# Patient Record
Sex: Female | Born: 1975
Health system: Southern US, Community
[De-identification: ages and names within clinical notes are randomized; demographics above are authoritative.]

## PROBLEM LIST (undated history)

## (undated) DIAGNOSIS — T7840XA Allergy, unspecified, initial encounter: Secondary | ICD-10-CM

## (undated) HISTORY — DX: Allergy, unspecified, initial encounter: T78.40XA

## (undated) HISTORY — PX: OTHER SURGICAL HISTORY: SHX169

---

## 1996-03-06 HISTORY — PX: FRACTURE SURGERY: SHX138

## 1999-05-26 ENCOUNTER — Encounter: Admission: RE | Admit: 1999-05-26 | Discharge: 1999-06-29 | Payer: Self-pay | Admitting: Otolaryngology

## 1999-09-25 ENCOUNTER — Emergency Department (HOSPITAL_COMMUNITY): Admission: EM | Admit: 1999-09-25 | Discharge: 1999-09-25 | Payer: Self-pay | Admitting: Emergency Medicine

## 2000-01-06 ENCOUNTER — Emergency Department (HOSPITAL_COMMUNITY): Admission: EM | Admit: 2000-01-06 | Discharge: 2000-01-06 | Payer: Self-pay | Admitting: Emergency Medicine

## 2000-02-07 ENCOUNTER — Emergency Department (HOSPITAL_COMMUNITY): Admission: EM | Admit: 2000-02-07 | Discharge: 2000-02-07 | Payer: Self-pay | Admitting: Emergency Medicine

## 2000-02-07 ENCOUNTER — Encounter: Payer: Self-pay | Admitting: Emergency Medicine

## 2000-04-24 ENCOUNTER — Inpatient Hospital Stay (HOSPITAL_COMMUNITY): Admission: AD | Admit: 2000-04-24 | Discharge: 2000-04-24 | Payer: Self-pay | Admitting: *Deleted

## 2000-06-12 ENCOUNTER — Emergency Department (HOSPITAL_COMMUNITY): Admission: EM | Admit: 2000-06-12 | Discharge: 2000-06-13 | Payer: Self-pay | Admitting: *Deleted

## 2000-06-12 ENCOUNTER — Encounter: Payer: Self-pay | Admitting: *Deleted

## 2000-08-30 ENCOUNTER — Emergency Department (HOSPITAL_COMMUNITY): Admission: EM | Admit: 2000-08-30 | Discharge: 2000-08-30 | Payer: Self-pay | Admitting: Emergency Medicine

## 2000-09-04 ENCOUNTER — Encounter: Payer: Self-pay | Admitting: Emergency Medicine

## 2000-09-04 ENCOUNTER — Inpatient Hospital Stay (HOSPITAL_COMMUNITY): Admission: EM | Admit: 2000-09-04 | Discharge: 2000-09-06 | Payer: Self-pay

## 2000-09-04 ENCOUNTER — Encounter: Payer: Self-pay | Admitting: Vascular Surgery

## 2000-09-05 ENCOUNTER — Encounter: Payer: Self-pay | Admitting: Neurology

## 2000-10-31 ENCOUNTER — Encounter: Payer: Self-pay | Admitting: Obstetrics

## 2000-10-31 ENCOUNTER — Inpatient Hospital Stay (HOSPITAL_COMMUNITY): Admission: AD | Admit: 2000-10-31 | Discharge: 2000-10-31 | Payer: Self-pay | Admitting: *Deleted

## 2000-10-31 ENCOUNTER — Ambulatory Visit (HOSPITAL_COMMUNITY): Admission: RE | Admit: 2000-10-31 | Discharge: 2000-10-31 | Payer: Self-pay | Admitting: Obstetrics

## 2000-11-08 ENCOUNTER — Inpatient Hospital Stay (HOSPITAL_COMMUNITY): Admission: AD | Admit: 2000-11-08 | Discharge: 2000-11-08 | Payer: Self-pay | Admitting: Obstetrics & Gynecology

## 2001-01-03 ENCOUNTER — Inpatient Hospital Stay (HOSPITAL_COMMUNITY): Admission: AD | Admit: 2001-01-03 | Discharge: 2001-01-03 | Payer: Self-pay | Admitting: *Deleted

## 2001-01-10 ENCOUNTER — Inpatient Hospital Stay (HOSPITAL_COMMUNITY): Admission: AD | Admit: 2001-01-10 | Discharge: 2001-01-10 | Payer: Self-pay | Admitting: Obstetrics & Gynecology

## 2001-01-17 ENCOUNTER — Ambulatory Visit (HOSPITAL_COMMUNITY): Admission: RE | Admit: 2001-01-17 | Discharge: 2001-01-17 | Payer: Self-pay | Admitting: *Deleted

## 2001-03-01 ENCOUNTER — Inpatient Hospital Stay (HOSPITAL_COMMUNITY): Admission: AD | Admit: 2001-03-01 | Discharge: 2001-03-01 | Payer: Self-pay | Admitting: *Deleted

## 2001-03-01 ENCOUNTER — Inpatient Hospital Stay (HOSPITAL_COMMUNITY): Admission: AD | Admit: 2001-03-01 | Discharge: 2001-03-01 | Payer: Self-pay | Admitting: Obstetrics & Gynecology

## 2001-03-01 ENCOUNTER — Encounter: Payer: Self-pay | Admitting: *Deleted

## 2001-03-19 ENCOUNTER — Inpatient Hospital Stay (HOSPITAL_COMMUNITY): Admission: AD | Admit: 2001-03-19 | Discharge: 2001-03-19 | Payer: Self-pay | Admitting: Obstetrics

## 2001-04-19 ENCOUNTER — Observation Stay (HOSPITAL_COMMUNITY): Admission: AD | Admit: 2001-04-19 | Discharge: 2001-04-20 | Payer: Self-pay | Admitting: *Deleted

## 2001-04-25 ENCOUNTER — Encounter: Admission: RE | Admit: 2001-04-25 | Discharge: 2001-04-25 | Payer: Self-pay | Admitting: *Deleted

## 2001-05-03 ENCOUNTER — Inpatient Hospital Stay (HOSPITAL_COMMUNITY): Admission: RE | Admit: 2001-05-03 | Discharge: 2001-05-03 | Payer: Self-pay | Admitting: *Deleted

## 2001-05-06 ENCOUNTER — Encounter: Payer: Self-pay | Admitting: *Deleted

## 2001-05-06 ENCOUNTER — Inpatient Hospital Stay (HOSPITAL_COMMUNITY): Admission: RE | Admit: 2001-05-06 | Discharge: 2001-05-06 | Payer: Self-pay | Admitting: *Deleted

## 2001-05-09 ENCOUNTER — Encounter: Admission: RE | Admit: 2001-05-09 | Discharge: 2001-05-09 | Payer: Self-pay | Admitting: *Deleted

## 2001-05-13 ENCOUNTER — Inpatient Hospital Stay (HOSPITAL_COMMUNITY): Admission: AD | Admit: 2001-05-13 | Discharge: 2001-05-13 | Payer: Self-pay | Admitting: *Deleted

## 2001-05-16 ENCOUNTER — Encounter (HOSPITAL_COMMUNITY): Admission: RE | Admit: 2001-05-16 | Discharge: 2001-05-27 | Payer: Self-pay | Admitting: *Deleted

## 2001-05-16 ENCOUNTER — Encounter: Admission: RE | Admit: 2001-05-16 | Discharge: 2001-05-16 | Payer: Self-pay | Admitting: *Deleted

## 2001-05-22 ENCOUNTER — Inpatient Hospital Stay (HOSPITAL_COMMUNITY): Admission: AD | Admit: 2001-05-22 | Discharge: 2001-05-22 | Payer: Self-pay | Admitting: Obstetrics and Gynecology

## 2001-05-22 ENCOUNTER — Encounter: Payer: Self-pay | Admitting: Obstetrics and Gynecology

## 2001-05-28 ENCOUNTER — Inpatient Hospital Stay (HOSPITAL_COMMUNITY): Admission: AD | Admit: 2001-05-28 | Discharge: 2001-06-01 | Payer: Self-pay | Admitting: *Deleted

## 2003-08-22 ENCOUNTER — Emergency Department (HOSPITAL_COMMUNITY): Admission: EM | Admit: 2003-08-22 | Discharge: 2003-08-22 | Payer: Self-pay | Admitting: Emergency Medicine

## 2005-08-09 ENCOUNTER — Ambulatory Visit (HOSPITAL_COMMUNITY): Admission: RE | Admit: 2005-08-09 | Discharge: 2005-08-09 | Payer: Self-pay | Admitting: Obstetrics & Gynecology

## 2006-02-23 ENCOUNTER — Ambulatory Visit (HOSPITAL_COMMUNITY): Admission: RE | Admit: 2006-02-23 | Discharge: 2006-02-23 | Payer: Self-pay | Admitting: Gastroenterology

## 2006-05-08 ENCOUNTER — Encounter: Admission: RE | Admit: 2006-05-08 | Discharge: 2006-05-08 | Payer: Self-pay | Admitting: Gastroenterology

## 2008-11-03 ENCOUNTER — Ambulatory Visit (HOSPITAL_COMMUNITY): Admission: AD | Admit: 2008-11-03 | Discharge: 2008-11-03 | Payer: Self-pay | Admitting: Obstetrics and Gynecology

## 2008-11-03 ENCOUNTER — Encounter: Payer: Self-pay | Admitting: Emergency Medicine

## 2008-11-03 ENCOUNTER — Ambulatory Visit: Payer: Self-pay | Admitting: Family Medicine

## 2008-11-18 ENCOUNTER — Ambulatory Visit: Payer: Self-pay | Admitting: Family Medicine

## 2008-11-18 ENCOUNTER — Encounter: Payer: Self-pay | Admitting: Obstetrics and Gynecology

## 2008-11-18 LAB — CONVERTED CEMR LAB
Antibody Screen: NEGATIVE
Basophils Relative: 0 % (ref 0–1)
Eosinophils Absolute: 0.2 10*3/uL (ref 0.0–0.7)
Eosinophils Relative: 3 % (ref 0–5)
HCT: 33.2 % — ABNORMAL LOW (ref 36.0–46.0)
Hepatitis B Surface Ag: NEGATIVE
Hgb A2 Quant: 2.9 % (ref 2.2–3.2)
Hgb F Quant: 0 % (ref 0.0–2.0)
Hgb S Quant: 0 % (ref 0.0–0.0)
MCV: 100 fL (ref 78.0–100.0)
RBC: 3.32 M/uL — ABNORMAL LOW (ref 3.87–5.11)
Rh Type: POSITIVE
Rubella: 88.9 intl units/mL — ABNORMAL HIGH
WBC: 7 10*3/uL (ref 4.0–10.5)

## 2008-12-16 ENCOUNTER — Ambulatory Visit: Payer: Self-pay | Admitting: Obstetrics and Gynecology

## 2008-12-16 LAB — CONVERTED CEMR LAB: Vitamin B-12: 545 pg/mL (ref 211–911)

## 2008-12-21 ENCOUNTER — Encounter: Payer: Self-pay | Admitting: Family Medicine

## 2008-12-21 ENCOUNTER — Ambulatory Visit (HOSPITAL_COMMUNITY): Admission: RE | Admit: 2008-12-21 | Discharge: 2008-12-21 | Payer: Self-pay | Admitting: Obstetrics and Gynecology

## 2009-01-11 ENCOUNTER — Ambulatory Visit (HOSPITAL_COMMUNITY): Admission: RE | Admit: 2009-01-11 | Discharge: 2009-01-11 | Payer: Self-pay | Admitting: Obstetrics and Gynecology

## 2009-01-13 ENCOUNTER — Ambulatory Visit: Payer: Self-pay | Admitting: Obstetrics and Gynecology

## 2009-01-19 ENCOUNTER — Inpatient Hospital Stay (HOSPITAL_COMMUNITY): Admission: AD | Admit: 2009-01-19 | Discharge: 2009-01-19 | Payer: Self-pay | Admitting: Obstetrics & Gynecology

## 2009-01-19 ENCOUNTER — Ambulatory Visit: Payer: Self-pay | Admitting: Obstetrics and Gynecology

## 2009-01-25 ENCOUNTER — Ambulatory Visit (HOSPITAL_COMMUNITY): Admission: RE | Admit: 2009-01-25 | Discharge: 2009-01-25 | Payer: Self-pay | Admitting: Obstetrics and Gynecology

## 2009-01-27 ENCOUNTER — Ambulatory Visit: Payer: Self-pay | Admitting: Obstetrics and Gynecology

## 2009-02-10 ENCOUNTER — Ambulatory Visit (HOSPITAL_COMMUNITY): Admission: RE | Admit: 2009-02-10 | Discharge: 2009-02-10 | Payer: Self-pay | Admitting: Obstetrics and Gynecology

## 2009-02-10 ENCOUNTER — Ambulatory Visit: Payer: Self-pay | Admitting: Obstetrics and Gynecology

## 2009-02-17 ENCOUNTER — Ambulatory Visit (HOSPITAL_COMMUNITY): Admission: RE | Admit: 2009-02-17 | Discharge: 2009-02-17 | Payer: Self-pay | Admitting: Obstetrics & Gynecology

## 2009-02-17 ENCOUNTER — Encounter: Payer: Self-pay | Admitting: Family

## 2009-02-17 ENCOUNTER — Ambulatory Visit: Payer: Self-pay | Admitting: Obstetrics and Gynecology

## 2009-02-17 LAB — CONVERTED CEMR LAB
Anticardiolipin IgA: <3 (ref <10)
Anticardiolipin IgG: <3 (ref <10)
Anticardiolipin IgM: <3 (ref <10)
Homocysteine: 4.8 micromoles/L (ref 4.0–15.4)
Protein S Ag, Total: 75 % (ref 70–140)

## 2009-02-25 ENCOUNTER — Ambulatory Visit: Payer: Self-pay | Admitting: Obstetrics & Gynecology

## 2009-03-04 ENCOUNTER — Ambulatory Visit: Payer: Self-pay | Admitting: Obstetrics & Gynecology

## 2009-03-09 ENCOUNTER — Ambulatory Visit: Payer: Self-pay | Admitting: Family Medicine

## 2009-03-11 ENCOUNTER — Ambulatory Visit: Payer: Self-pay | Admitting: Obstetrics & Gynecology

## 2009-03-18 ENCOUNTER — Ambulatory Visit: Payer: Self-pay | Admitting: Family Medicine

## 2009-03-22 ENCOUNTER — Ambulatory Visit: Payer: Self-pay | Admitting: Obstetrics and Gynecology

## 2009-03-29 ENCOUNTER — Ambulatory Visit: Payer: Self-pay | Admitting: Obstetrics & Gynecology

## 2009-04-05 ENCOUNTER — Ambulatory Visit: Payer: Self-pay | Admitting: Family Medicine

## 2009-04-12 ENCOUNTER — Ambulatory Visit: Payer: Self-pay | Admitting: Obstetrics & Gynecology

## 2009-04-12 ENCOUNTER — Encounter: Payer: Self-pay | Admitting: Obstetrics & Gynecology

## 2009-04-12 LAB — CONVERTED CEMR LAB
HCT: 32.9 % — ABNORMAL LOW (ref 36.0–46.0)
Hemoglobin: 10.7 g/dL — ABNORMAL LOW (ref 12.0–15.0)
MCHC: 32.5 g/dL (ref 30.0–36.0)
MCV: 95.6 fL (ref 78.0–100.0)
RDW: 13.7 % (ref 11.5–15.5)

## 2009-04-19 ENCOUNTER — Ambulatory Visit: Payer: Self-pay | Admitting: Obstetrics & Gynecology

## 2009-04-24 ENCOUNTER — Ambulatory Visit: Payer: Self-pay | Admitting: Advanced Practice Midwife

## 2009-04-24 ENCOUNTER — Inpatient Hospital Stay (HOSPITAL_COMMUNITY): Admission: AD | Admit: 2009-04-24 | Discharge: 2009-04-24 | Payer: Self-pay | Admitting: Family Medicine

## 2009-04-26 ENCOUNTER — Ambulatory Visit: Payer: Self-pay | Admitting: Obstetrics & Gynecology

## 2009-04-28 ENCOUNTER — Inpatient Hospital Stay (HOSPITAL_COMMUNITY): Admission: AD | Admit: 2009-04-28 | Discharge: 2009-04-29 | Payer: Self-pay | Admitting: Obstetrics and Gynecology

## 2009-05-03 ENCOUNTER — Ambulatory Visit: Payer: Self-pay | Admitting: Obstetrics & Gynecology

## 2009-05-05 ENCOUNTER — Inpatient Hospital Stay (HOSPITAL_COMMUNITY): Admission: AD | Admit: 2009-05-05 | Discharge: 2009-05-05 | Payer: Self-pay | Admitting: Obstetrics & Gynecology

## 2009-05-17 ENCOUNTER — Ambulatory Visit: Payer: Self-pay | Admitting: Obstetrics & Gynecology

## 2009-05-22 ENCOUNTER — Inpatient Hospital Stay (HOSPITAL_COMMUNITY): Admission: AD | Admit: 2009-05-22 | Discharge: 2009-05-22 | Payer: Self-pay | Admitting: Obstetrics & Gynecology

## 2009-05-24 ENCOUNTER — Encounter: Payer: Self-pay | Admitting: Family

## 2009-05-24 ENCOUNTER — Ambulatory Visit: Payer: Self-pay | Admitting: Obstetrics & Gynecology

## 2009-05-24 LAB — CONVERTED CEMR LAB
Chlamydia, DNA Probe: NEGATIVE
GC Probe Amp, Genital: NEGATIVE

## 2009-05-25 ENCOUNTER — Encounter: Payer: Self-pay | Admitting: Family

## 2009-05-31 ENCOUNTER — Ambulatory Visit: Payer: Self-pay | Admitting: Obstetrics & Gynecology

## 2009-06-07 ENCOUNTER — Other Ambulatory Visit: Payer: Self-pay | Admitting: Obstetrics & Gynecology

## 2009-06-07 ENCOUNTER — Ambulatory Visit: Payer: Self-pay | Admitting: Obstetrics & Gynecology

## 2009-06-08 ENCOUNTER — Ambulatory Visit: Payer: Self-pay | Admitting: Obstetrics and Gynecology

## 2009-06-08 ENCOUNTER — Inpatient Hospital Stay (HOSPITAL_COMMUNITY): Admission: AD | Admit: 2009-06-08 | Discharge: 2009-06-10 | Payer: Self-pay | Admitting: Obstetrics & Gynecology

## 2009-06-09 HISTORY — PX: TUBAL LIGATION: SHX77

## 2009-07-16 ENCOUNTER — Ambulatory Visit: Payer: Self-pay | Admitting: Obstetrics and Gynecology

## 2010-05-21 LAB — POCT URINALYSIS DIP (DEVICE)
Glucose, UA: NEGATIVE mg/dL
Urobilinogen, UA: 0.2 mg/dL (ref 0.0–1.0)

## 2010-05-22 LAB — POCT URINALYSIS DIP (DEVICE)
Bilirubin Urine: NEGATIVE
Glucose, UA: NEGATIVE mg/dL
Ketones, ur: NEGATIVE mg/dL
pH: 5.5 (ref 5.0–8.0)

## 2010-05-25 LAB — CBC
HCT: 33.7 % — ABNORMAL LOW (ref 36.0–46.0)
HCT: 33.1 % — ABNORMAL LOW (ref 36.0–46.0)
HCT: 35.3 % — ABNORMAL LOW (ref 36.0–46.0)
Hemoglobin: 11.1 g/dL — ABNORMAL LOW (ref 12.0–15.0)
Hemoglobin: 11.9 g/dL — ABNORMAL LOW (ref 12.0–15.0)
MCHC: 33.4 g/dL (ref 30.0–36.0)
MCV: 99.8 fL (ref 78.0–100.0)
MCV: 101.4 fL — ABNORMAL HIGH (ref 78.0–100.0)
Platelets: 222 10*3/uL (ref 150–400)
RBC: 3.27 MIL/uL — ABNORMAL LOW (ref 3.87–5.11)
RBC: 3.53 MIL/uL — ABNORMAL LOW (ref 3.87–5.11)
RDW: 14.2 % (ref 11.5–15.5)
WBC: 12.4 10*3/uL — ABNORMAL HIGH (ref 4.0–10.5)

## 2010-05-25 LAB — POCT URINALYSIS DIP (DEVICE)
Bilirubin Urine: NEGATIVE
Bilirubin Urine: NEGATIVE
Glucose, UA: NEGATIVE mg/dL
Glucose, UA: NEGATIVE mg/dL
Hgb urine dipstick: NEGATIVE
Hgb urine dipstick: NEGATIVE
Ketones, ur: NEGATIVE mg/dL
Ketones, ur: NEGATIVE mg/dL
Ketones, ur: NEGATIVE mg/dL
Nitrite: NEGATIVE
Nitrite: NEGATIVE
Protein, ur: NEGATIVE mg/dL
Protein, ur: NEGATIVE mg/dL
Protein, ur: NEGATIVE mg/dL
Specific Gravity, Urine: 1.015 (ref 1.005–1.030)
Specific Gravity, Urine: <=1.005 (ref 1.005–1.030)
Urobilinogen, UA: 0.2 mg/dL (ref 0.0–1.0)
Urobilinogen, UA: 0.2 mg/dL (ref 0.0–1.0)
Urobilinogen, UA: 0.2 mg/dL (ref 0.0–1.0)
pH: 5.5 (ref 5.0–8.0)
pH: 6 (ref 5.0–8.0)
pH: 6 (ref 5.0–8.0)
pH: 7 (ref 5.0–8.0)

## 2010-05-25 LAB — URINALYSIS, ROUTINE W REFLEX MICROSCOPIC
Bilirubin Urine: NEGATIVE
Bilirubin Urine: NEGATIVE
Glucose, UA: NEGATIVE mg/dL
Ketones, ur: NEGATIVE mg/dL
Protein, ur: NEGATIVE mg/dL
Protein, ur: NEGATIVE mg/dL

## 2010-05-25 LAB — WET PREP, GENITAL
Clue Cells Wet Prep HPF POC: NONE SEEN
Trich, Wet Prep: NONE SEEN
Trich, Wet Prep: NONE SEEN
Yeast Wet Prep HPF POC: NONE SEEN

## 2010-05-27 LAB — POCT URINALYSIS DIP (DEVICE)
Bilirubin Urine: NEGATIVE
Bilirubin Urine: NEGATIVE
Glucose, UA: NEGATIVE mg/dL
Glucose, UA: NEGATIVE mg/dL
Hgb urine dipstick: NEGATIVE
Ketones, ur: NEGATIVE mg/dL
Ketones, ur: NEGATIVE mg/dL
Nitrite: NEGATIVE
Nitrite: NEGATIVE
Protein, ur: 30 mg/dL — AB
Protein, ur: NEGATIVE mg/dL
Specific Gravity, Urine: 1.015 (ref 1.005–1.030)
Urobilinogen, UA: 0.2 mg/dL (ref 0.0–1.0)
Urobilinogen, UA: 0.2 mg/dL (ref 0.0–1.0)
pH: 6 (ref 5.0–8.0)

## 2010-05-27 LAB — URINALYSIS, ROUTINE W REFLEX MICROSCOPIC
Bilirubin Urine: NEGATIVE
Glucose, UA: NEGATIVE mg/dL
Hgb urine dipstick: NEGATIVE
Ketones, ur: NEGATIVE mg/dL
Protein, ur: NEGATIVE mg/dL
pH: 6.5 (ref 5.0–8.0)

## 2010-05-27 LAB — URINE MICROSCOPIC-ADD ON

## 2010-06-06 LAB — POCT URINALYSIS DIP (DEVICE)
Bilirubin Urine: NEGATIVE
Glucose, UA: NEGATIVE mg/dL
Nitrite: NEGATIVE
Urobilinogen, UA: 0.2 mg/dL (ref 0.0–1.0)

## 2010-06-07 LAB — POCT URINALYSIS DIP (DEVICE)
Bilirubin Urine: NEGATIVE
Bilirubin Urine: NEGATIVE
Glucose, UA: NEGATIVE mg/dL
Glucose, UA: NEGATIVE mg/dL
Ketones, ur: NEGATIVE mg/dL
Ketones, ur: NEGATIVE mg/dL
Nitrite: NEGATIVE
Specific Gravity, Urine: 1.01 (ref 1.005–1.030)

## 2010-06-08 LAB — POCT URINALYSIS DIP (DEVICE)
Bilirubin Urine: NEGATIVE
Bilirubin Urine: NEGATIVE
Glucose, UA: NEGATIVE mg/dL
Glucose, UA: NEGATIVE mg/dL
Hgb urine dipstick: NEGATIVE
Ketones, ur: NEGATIVE mg/dL
Ketones, ur: NEGATIVE mg/dL
Nitrite: NEGATIVE
Specific Gravity, Urine: 1.01 (ref 1.005–1.030)
Urobilinogen, UA: 0.2 mg/dL (ref 0.0–1.0)

## 2010-06-09 LAB — POCT URINALYSIS DIP (DEVICE)
Bilirubin Urine: NEGATIVE
Ketones, ur: NEGATIVE mg/dL
Specific Gravity, Urine: 1.01 (ref 1.005–1.030)
pH: 6.5 (ref 5.0–8.0)

## 2010-06-10 LAB — POCT URINALYSIS DIP (DEVICE)
Glucose, UA: NEGATIVE mg/dL
Ketones, ur: NEGATIVE mg/dL
Protein, ur: NEGATIVE mg/dL
Specific Gravity, Urine: <=1.005 (ref 1.005–1.030)

## 2010-06-11 LAB — WET PREP, GENITAL
Clue Cells Wet Prep HPF POC: NONE SEEN
Trich, Wet Prep: NONE SEEN
WBC, Wet Prep HPF POC: NONE SEEN
Yeast Wet Prep HPF POC: NONE SEEN

## 2010-06-11 LAB — CBC
HCT: 25.6 % — ABNORMAL LOW (ref 36.0–46.0)
HCT: 35.7 % — ABNORMAL LOW (ref 36.0–46.0)
Hemoglobin: 8.8 g/dL — ABNORMAL LOW (ref 12.0–15.0)
Hemoglobin: 12.3 g/dL (ref 12.0–15.0)
MCHC: 34.3 g/dL (ref 30.0–36.0)
MCV: 98.3 fL (ref 78.0–100.0)
RBC: 2.57 MIL/uL — ABNORMAL LOW (ref 3.87–5.11)
RDW: 13 % (ref 11.5–15.5)
WBC: 8.3 10*3/uL (ref 4.0–10.5)

## 2010-06-11 LAB — CROSSMATCH: Antibody Screen: NEGATIVE

## 2010-06-11 LAB — URINALYSIS, ROUTINE W REFLEX MICROSCOPIC
Bilirubin Urine: NEGATIVE
Glucose, UA: NEGATIVE mg/dL
Ketones, ur: 15 mg/dL — AB
Nitrite: NEGATIVE
Specific Gravity, Urine: 1.019 (ref 1.005–1.030)
pH: 7 (ref 5.0–8.0)

## 2010-06-11 LAB — DIFFERENTIAL
Eosinophils Absolute: 0.4 10*3/uL (ref 0.0–0.7)
Eosinophils Relative: 4 % (ref 0–5)
Lymphocytes Relative: 30 % (ref 12–46)
Lymphs Abs: 2.5 10*3/uL (ref 0.7–4.0)
Monocytes Absolute: 0.7 10*3/uL (ref 0.1–1.0)
Monocytes Relative: 8 % (ref 3–12)

## 2010-06-11 LAB — BASIC METABOLIC PANEL
Chloride: 103 mEq/L (ref 96–112)
GFR calc non Af Amer: >60 mL/min (ref >60)
Potassium: 4.1 mEq/L (ref 3.5–5.1)
Sodium: 133 mEq/L — ABNORMAL LOW (ref 135–145)

## 2010-06-11 LAB — GC/CHLAMYDIA PROBE AMP, GENITAL: GC Probe Amp, Genital: NEGATIVE

## 2010-07-19 NOTE — Op Note (Signed)
Lauren Frost, Lauren Frost                ACCOUNT NO.:  [id number]   MEDICAL RECORD NO.:  [id number]          PATIENT TYPE:  AMB   LOCATION:  [facility]                          FACILITY:  [facility]   PHYSICIAN:  Tanya S. Shawnie Pons, M.D.   DATE OF BIRTH:  20-Jan-1976   DATE OF PROCEDURE:  11/03/2008  DATE OF DISCHARGE:                               OPERATIVE REPORT   PREOPERATIVE DIAGNOSIS:  Hemoperitoneum.   POSTOPERATIVE DIAGNOSES:  Hemoperitoneum, hemorrhagic ovarian cyst,  intrauterine pregnancy at 5 weeks.   PROCEDURE:  Diagnostic laparoscopy, evacuation of hemorrhage and clot,  and cauterization of the ovarian cyst wall.   SURGEON:  Shelbie Proctor. Shawnie Pons, MD   ASSISTANT:  None.   ANESTHESIA:  General and local.   FINDINGS:  Large hemoperitoneum of at least a liter, total of 1.5  liters.  A large defect in the patient's left ovary with adherent clot  and some minimal bleeding around the edge of the cyst.   SPECIMENS:  None.   ESTIMATED BLOOD LOSS:  One liter.   COMPLICATIONS:  None known.   REASON FOR PROCEDURE:  Briefly, the patient is a 35 year old gravida 3,  para 2, who is 5 weeks' pregnant with a known intrauterine pregnancy,  who presented to [facility], initially presented with acute abdominal  pain.  She was found to be pregnant, had an intrauterine pregnancy, plus  marked amount of clot and complex mixture in the cul-de-sac.  She was  sent from there to [facility] by Dr. Marcelle Overlie, who then had  an emergency insert care, was turned over to me.  She had further  ultrasound here that showed enlargement of the complex material and a  drop of her hemoglobin from 12 to 8.  So she probably had an acute  abdomen and bleeding in her belly from either heterotopic pregnancy or  an ovarian cyst.   PROCEDURE:  The patient was taken to the OR.  She was placed in dorsal  lithotomy in [place] stirrups.  She was prepped and draped in the usual  sterile fashion.  A Foley catheter was placed  inside the bladder.  A  speculum was placed inside the vagina to manipulate the uterus though  she has a known IUP.  Attention was then turned to the abdomen.  A 5 mL  of 0.25% Marcaine were injected about the umbilicus.  A vertical  incision was made through the umbilicus and the peritoneal cavity was  entered sharply.  Two inches of the fascia were tied with 0 Vicryl on UR-  6 needle.  A Hasson trocar was then placed inside this incision and the  pneumoperitoneum created.  Initially, a large amount of blood was noted  and a 5-mm port was placed suprapubically under direct visualization.  The Nezhat was used, however, there was so much clot that really was not  effective.  So the camera was changed to a 5-mm camera, and the large  attachments, further Nezhat was then used to evacuate a large amount of  clotted blood.  The tubes looked to be exceptionally normal.  The  patient's right ovary was also normal.  The left ovary was enlarged and  upon taking it up out of the cul-de-sac, a large defect was found in the  ovary approximately 1.5 to 2 cm.  There was adherent clot in the ovarian  wall and minimal bleeding from the ovarian cyst wall.  A second 5-mm  port was placed in the left lower quadrant under direct visualization.  The clot was cleaned out with the Nezhat and the bipolar was used to  cauterize any bleeders on the cyst wall.  There was minimal bleeding  noted from the edge of the ovary at the end of the case.  A large amount  of clot to still be evacuated, which was done.  The patient's appendix  was looked out and felt to be normal.  The patient's tubes also appeared  normal.  The liver edge was looked out and was felt to be normal.  The  uterus appeared normal.  Bowel structures appeared normal.  Once the  patient was felt to be hemostatic, both 5-mm trocars were removed under  direct visualization without difficulty.  A 10 mm, the Hassan trocar  also removed without difficulty.   The umbilical incision was closed with  aforementioned 0 Vicryl on the UR-6 and 2 figure-of-eights making sure  to close the fascia.  The top of the umbilical incision was closed with  3-0 Vicryl on an excellent needle in a subcuticular fashion.  The 5-mm  ports were closed with the same in subcuticular fashion and then the  incisions were closed with dermabond.  All instrument, needle, and lap  counts correct x2.  The sponge was taken out from the vagina.  The  patient was awakened and taken to recovery in stable condition.      Shelbie Proctor. Shawnie Pons, M.D.  Electronically Signed     TSP/MEDQ  D:  11/03/2008  T:  11/03/2008  Job:  [id number]

## 2010-07-22 NOTE — Consult Note (Signed)
[place]. [facility]  Patient:    Lauren Frost, Lauren Frost                        MRN: [id number] Proc. Date: 09/05/00 Adm. Date:  [id number] Attending:  Bennye Alm CC:         Di Kindle. Edilia Bo, M.D.  [facility], [address] [facility].   Consultation Report  HISTORY OF PRESENT ILLNESS:  Lauren Frost is a 35 year old right-handed black female born 1975-12-21 with a history of a left superficial jugular vein thrombosis that occurred spontaneously approximately a week ago.  Along with onset of the thrombosis, the patient developed onset of headache that has been daily in nature.  The patient notes that the headaches are whole encephalic unassociated with nausea or vomiting but the patient will have some blurred vision and does report some occasional double vision with the above events. The patient denies any numbness or weakness on the face, arms, or legs but does report an occasional problem with tingling in the fingers of the left hand and the toes in the left foot.  The patient has not had any problems with balance.  Denies any true neck stiffness but has some localized tenderness around the site of the left superficial jugular vein thrombosis.  Doppler study has excluded thrombus in the internal jugular vein.  The patient has also reported some swelling of the left leg but a Doppler study has not shown any evidence of venous thrombosis.  The patient denies any blackout spells or confusion.  No prior history of headaches is noted prior to the last week or so.  The patient denies any family history of migraines.  There is no family history of venous thrombosis.  A hypercoagulable state workup is underway. Neurology has been asked to see this patient for further evaluation.  PAST MEDICAL HISTORY: 1. Left superficial jugular vein thrombosis, as above. 2. History of headache. 3. History of left tibia-fibula fracture, status  post surgery in the past.  CURRENT MEDICATIONS:  The patient was on no medications prior to admission, other than over-the-counter medications.  ALLERGIES:  ASPIRIN.  SOCIAL HISTORY:  She smokes about a pack of cigarettes a week and denies regular alcohol use.  She does drink on occasion.  The patient denies any history of IV drug abuse or any other illicit drug use.  The patient is married, lives in the [place] area.  She has one child, age 74, alive and well.  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  FAMILY HISTORY:  Both parents are alive.  The patient has one sister who is alive and well.  Immediate family has no medical illnesses.  The patient denies any family history of diabetes, heart disease, high blood pressure, or migraines or venous thrombosis.  REVIEW OF SYSTEMS:  No fevers or chills.  The patient denies any problems with swallowing.  Denies shortness of breath, chest pain.  She does note some double vision, as above.  Denies any nausea, vomiting, balance problems.  She does note some dizziness off and on.  She has headaches off and on through the day.  The headaches may last about an hour or so.  PHYSICAL EXAMINATION:  VITAL SIGNS:  Blood pressure 132/69, heart rate 65, respiratory rate 16, temperature afebrile.  GENERAL:  This patient is a fairly well-developed black female who is alert and cooperative at the time of examination.  HEENT:  Head  is atraumatic.  Eyes:  Pupils are equal, round and reactive to light.  Disks are flat bilaterally.  Good venous pulsations are seen.  NECK:  Supple.  No carotid bruits are noted.  Obvious venous thrombosis noted on the left neck.  RESPIRATORY:  Clear.  CARDIOVASCULAR:  Regular rate and rhythm.  No obvious murmurs or rubs noted.  EXTREMITIES:  Without significant edema.  NEUROLOGIC:  Cranial nerves as above.  Facial symmetry is present.  The patient has good sensation of the face to pinprick and  soft touch bilaterally. The patient has good strength to facial muscles and the muscles to head turning and shoulder shrug bilaterally.  Visual fields are full to double simultaneous stimulation.  Extraocular movements appear to be intact.  Speech is well enunciated and not aphasia.  Motor testing reveals 5/5 strength on all four extremities.  Good symmetric motor tone is noted throughout.  Sensory testing is intact to pinprick, soft touch, vibratory sensation throughout. The patient has good finger-nose-finger and heel-to-shin bilaterally.  Gait is normal.  Tandem gait normal.  Romberg negative.  No pronator drift is seen. Deep tendon reflexes are symmetric and normal.  Toes are downgoing bilaterally.  LABORATORY DATA:  Sodium 139, potassium 3.7, chloride 106, CO2 30, glucose 96, BUN 5, creatinine 0.7, calcium 8.6.  INR 1.1.  White count 6.3, hemoglobin 12.3, hematocrit 35.7, MCV 92.5, platelets 253.  Lupus anticoagulant is pending.  Antithrombin III level pending.  Protein S and protein C levels pending.  Urine pregnancy test negative.  Venous Doppler study of the lower extremities is negative.  IMPRESSION: 1. History of recent onset of headache. 2. Left superficial jugular vein thrombosis. 3. History of tobacco abuse.  This patient is not on birth control pills at this point.  The patient does not have any prior history of venous thrombosis with the exception of some sort of a vein problem on the right arm as a child that she is not sure about, claiming that part of the vein had to be removed.  The patient is undergoing hypercoagulable stage workup, which is appropriate at this time.  The patient denies any trauma to the left neck, any infection around the left neck that might have induced the thrombosis.  The examination today shows evidence of excellent venous pulsations in the disks.  No papilledema is noted.  I do not  believe this patient has any increased intracranial  pressure.  Doppler studies have shown no involvement of the internal jugular vein.  PLAN: 1. MRI scan of the brain. 2. MRV. 3. We would also recommend checking a factor V Leiden study, homocystine    level, B12/folate level.  We will follow this patient while in-house.  We will initiate treatment with Amerge 2.5 mg one twice a day.  If this is not effective, may go on to use the DHE 45 protocol.  The patient may take narcotics at this point as needed. DD:  09/05/00 TD:  09/05/00 Job: [id number]

## 2010-07-22 NOTE — Consult Note (Signed)
[place]. [facility]  Patient:    Lauren Frost, Lauren Frost                        MRN: [id number] Proc. Date: 09/04/00 Adm. Date:  [id number] Attending:  Earline Mayotte R                          Consultation Report  REASON FOR CONSULTATION:  Superficial thrombophlebitis of the left neck.  HISTORY:  This is a 35 year old woman who had awakened on August 30, 2000 and noticed some swelling in her left neck.  She presented to the emergency department on that day and worked up at that time including an ultrasound of the neck which showed no evidence of deep venous thrombosis involving the internal  jugular vein of proximal subclavian veins, however, there was thrombus within the superficial veins of the left neck.  She was sent home with recommendation for warm compresses, nonsteroidal anti-inflammatory medicine and she was to follow up with me tomorrow, September 05, 2000.  I had not seen this patient previously.  I did not see her in the emergency room that day.  Today, she returns and she states that her symptoms have progressed despite conservative treatment.  Patient denies any previous history of clotting disorders.  She is unaware of any family history of clotting disorders.  She has no previous history of DVT or phlebitis.  She has been following her instructions with warm compresses to the neck and has been taking ibuprofen.  PAST MEDICAL HISTORY:  Unremarkable.  She denies any history of diabetes, hypertension, hypercholesterolemia or history of cardiac disease.  She does have a history of a murmur.  SOCIAL HISTORY:  She is married and has one daughter.  She smokes 1 pack of cigarettes every 10 days.  FAMILY HISTORY:  She denies any history of premature cardiovascular disease. There is no history of clotting disorders.  REVIEW OF SYSTEMS:  She has had no fever or chills.  She has had no other symptoms besides headaches and some intermittent blurred vision over the  last week.  PHYSICAL EXAMINATION:  VITAL SIGNS:  Blood pressure is 114/69.  Heart rate is 59.  Temperature is 96.9.  NECK:  She has what appears to be thrombosed veins of the left neck which are mildly tender.  There is no significant reaction around them.  She has no carotid bruits.  She has no supraclavicular bruits.  LUNGS:  Clear bilaterally to auscultation.  CARDIAC:  She has a regular rate and rhythm.  ABDOMEN:  Soft and nontender.  EXTREMITIES:  She has some mild bilateral lower extremity swelling.  IMAGING STUDY:  Patient did undergo a venous Duplex of the lower extremities here in the ER today which showed no evidence of DVT or superficial thrombus.  ASSESSMENT AND PLAN:  This patient has likely superficial thrombophlebitis of the left neck, however, she states that her symptoms have progressed despite conservative treatment and she feels that this area has gotten significantly larger.  I am examining her for the first time.  Given the progression of her symptoms and enlargement of this area, I have recommended that she be admitted and placed on intravenous heparin.  We will also obtain a hypercoagulable workup and consider starting Coumadin.  In addition, we will obtain further diagnostic studies of the neck, either CT or MRI, after I have had a chance to discuss this with the radiologist.  We will also continue her on her nonsteroidal anti-inflammatory medicines. DD:  09/04/00 TD:  09/04/00 Job: [id number]

## 2010-11-10 IMAGING — US US OB DETAIL+14 WK
1 series · 18 of 28 positions shown · non-contrast
Comparison: none

OBSTETRICAL ULTRASOUND:
 This ultrasound was performed in The [HOSPITAL], and the AS OB/GYN report will be stored to [REDACTED] PACS.  This report is also available in [HOSPITAL]?s accessANYware.

[Series 1: us ob detail+14 wk · 18 of 106 slices shown]
[im 1/106]
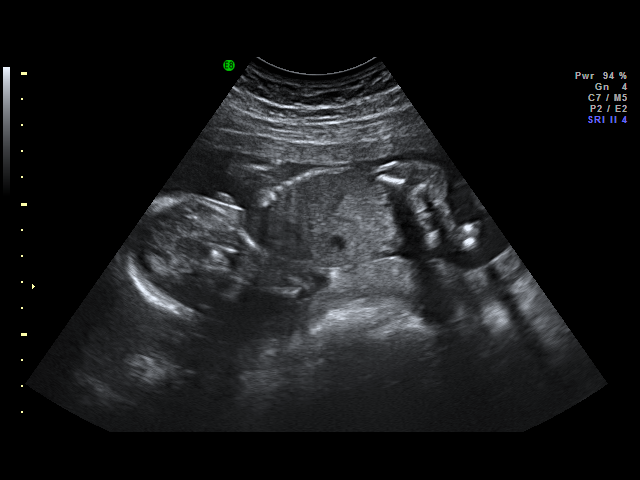
[im 8/106]
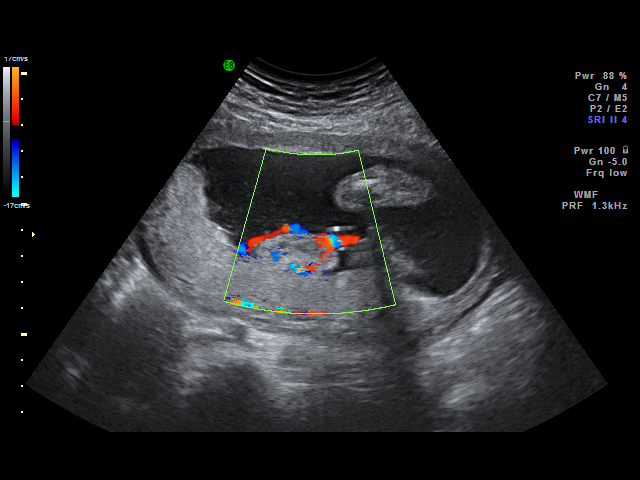
[im 12/106]
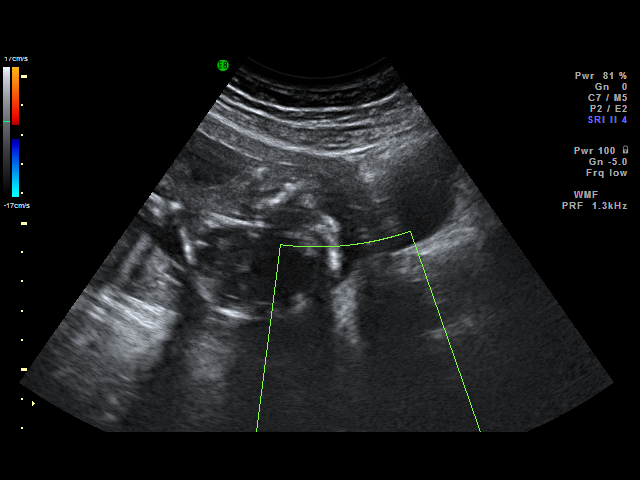
[im 20/106]
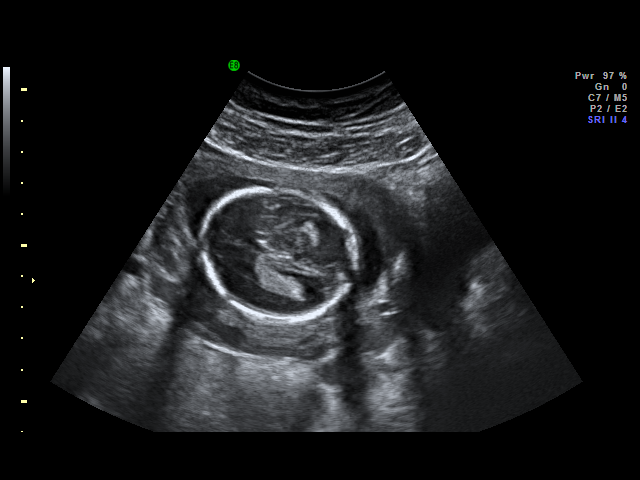
[im 28/106]
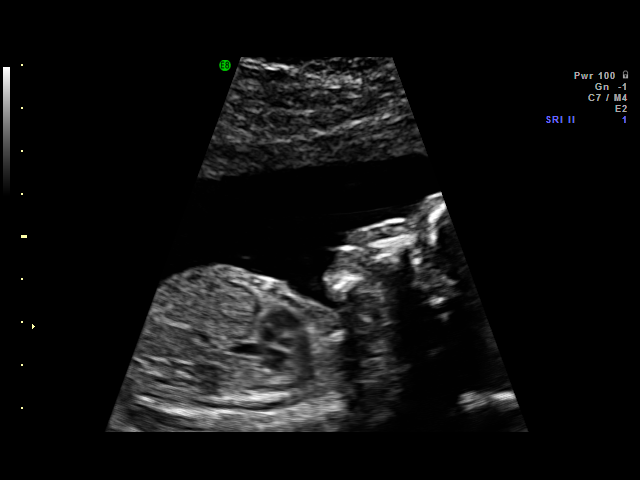
[im 32/106]
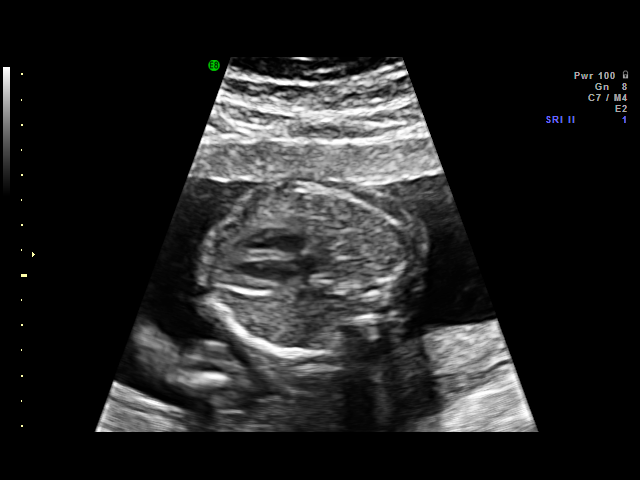
[im 39/106]
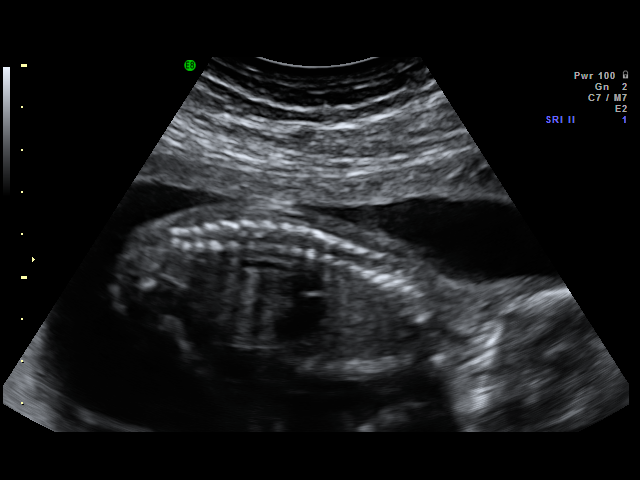
[im 43/106]
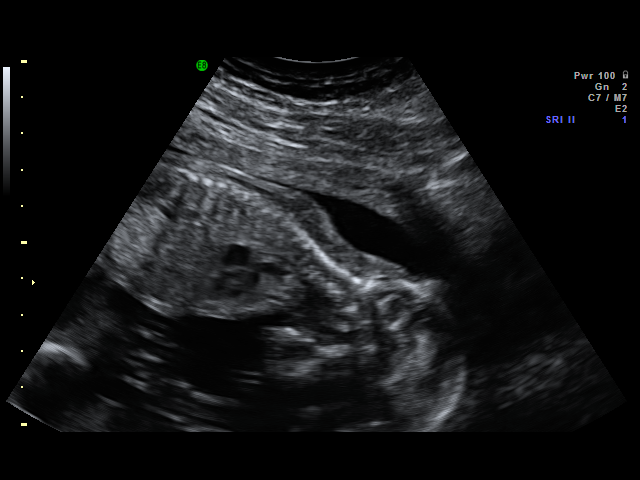
[im 51/106]
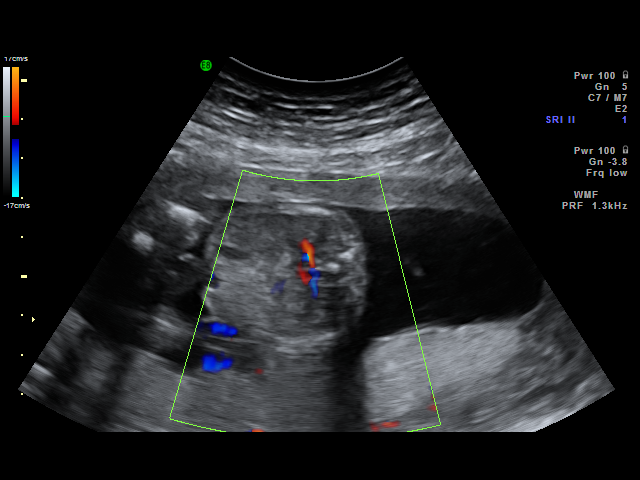
[im 55/106]
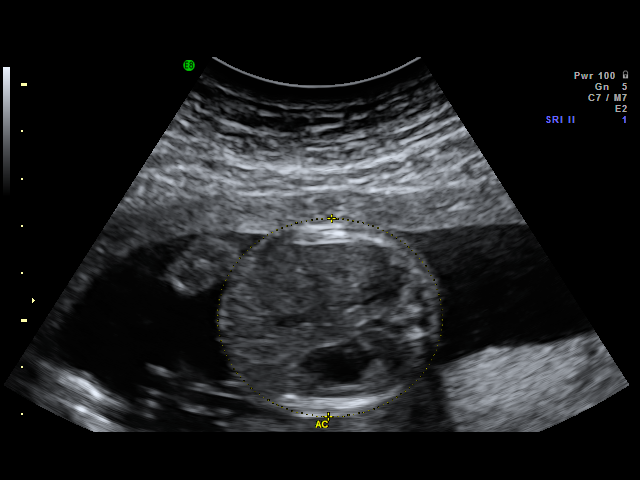
[im 63/106]
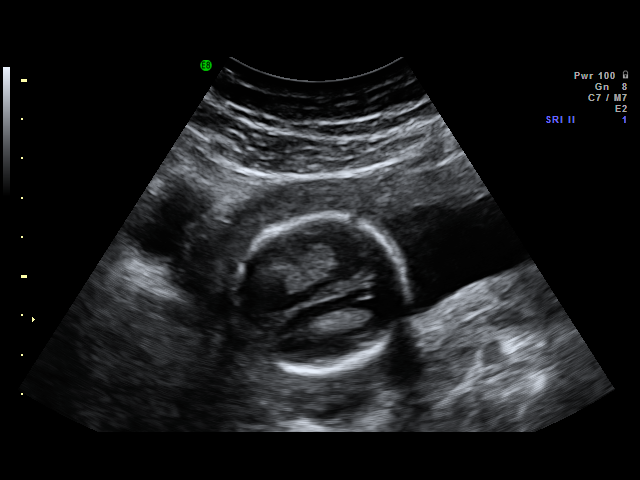
[im 67/106]
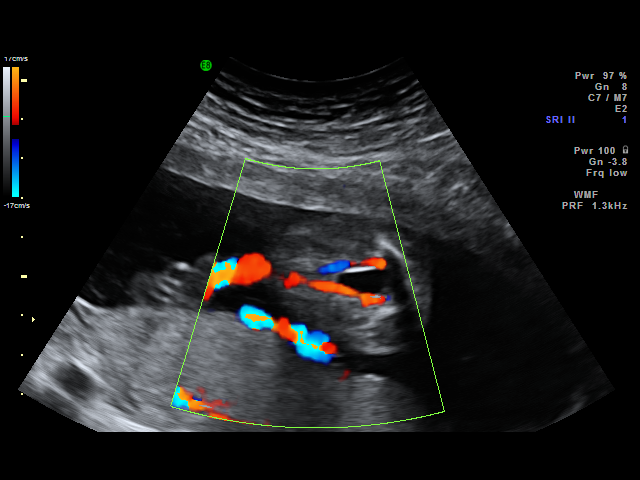
[im 74/106]
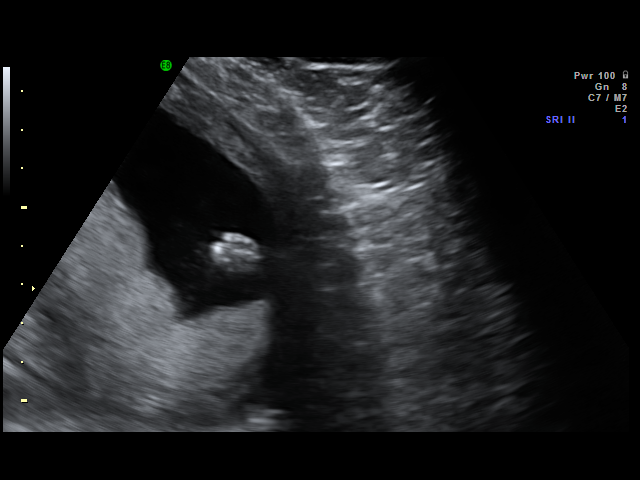
[im 82/106]
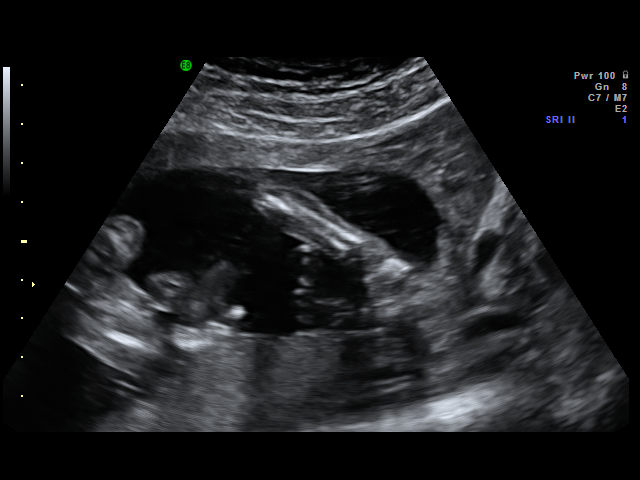
[im 86/106]
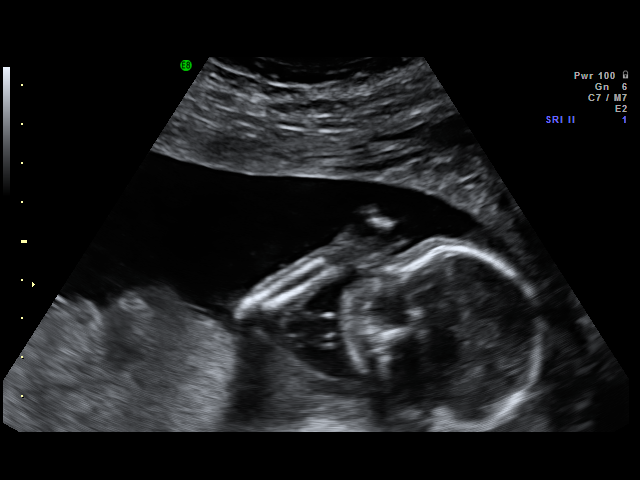
[im 94/106]
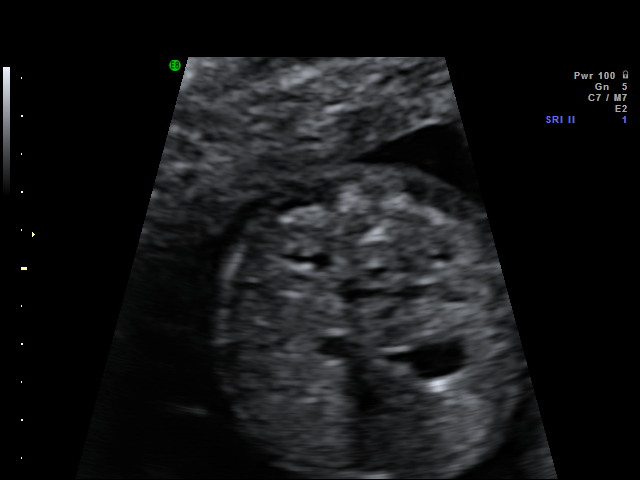
[im 98/106]
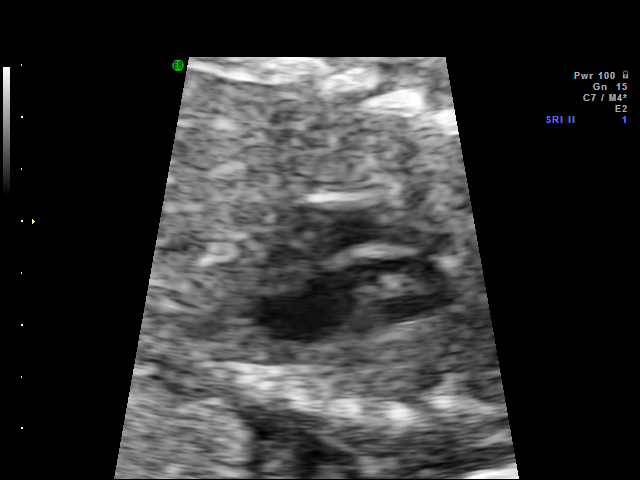
[im 106/106]
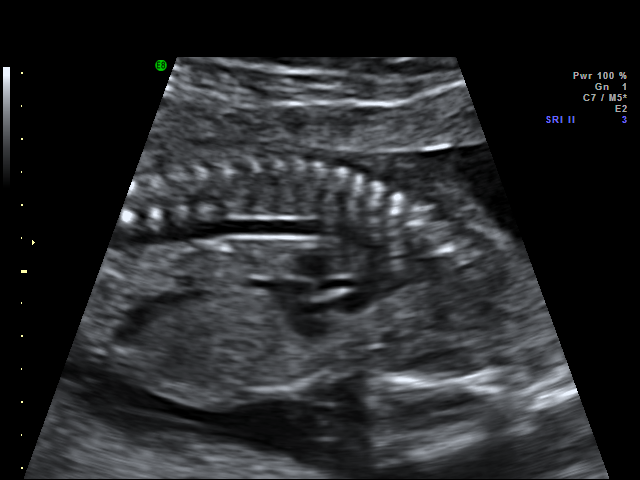

[18 of 28 positions shown; findings below may reference images not displayed]

IMPRESSION: [facility] OB/GYN has also been faxed to the ordering physician.

## 2010-11-26 IMAGING — US US OB TRANSVAGINAL
1 series · 7 of 7 positions shown · non-contrast
Comparison: none

OBSTETRICAL ULTRASOUND:
 This ultrasound exam was performed in the [HOSPITAL] Ultrasound Department.  The OB US report was generated in the AS system, and faxed to the ordering physician.  This report is also available in [HOSPITAL]?s AccessANYware and in [REDACTED] PACS.

[Series 1: us ob transvaginal · 7 of 7 slices shown]
[im 1/7]
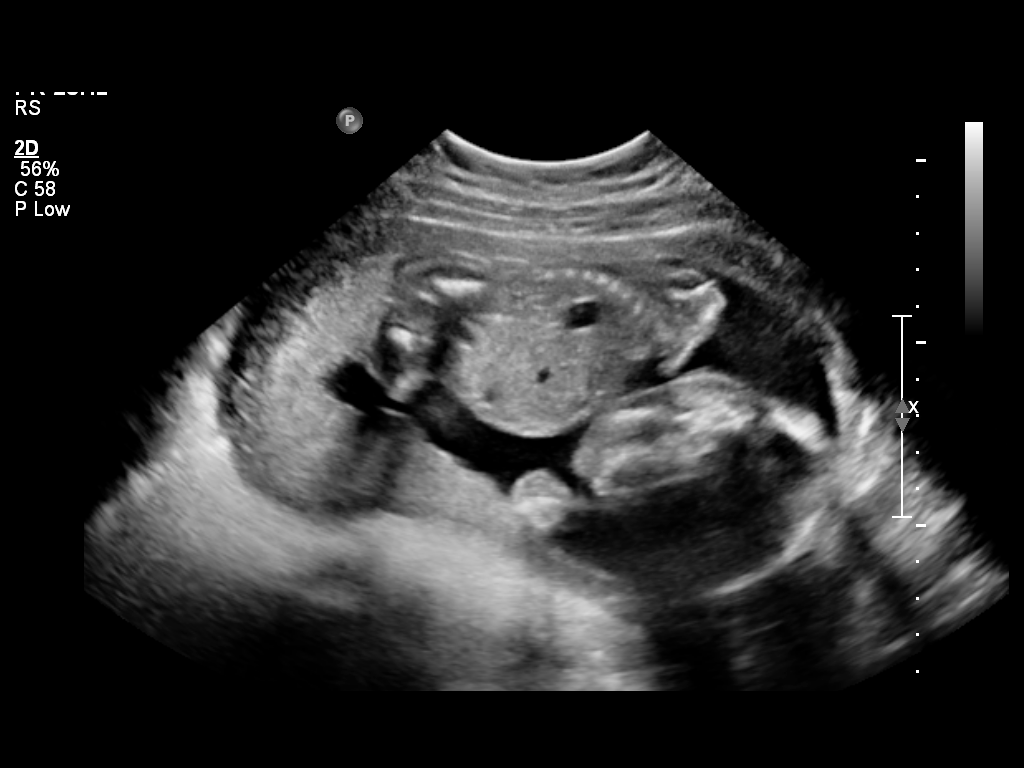
[im 2/7]
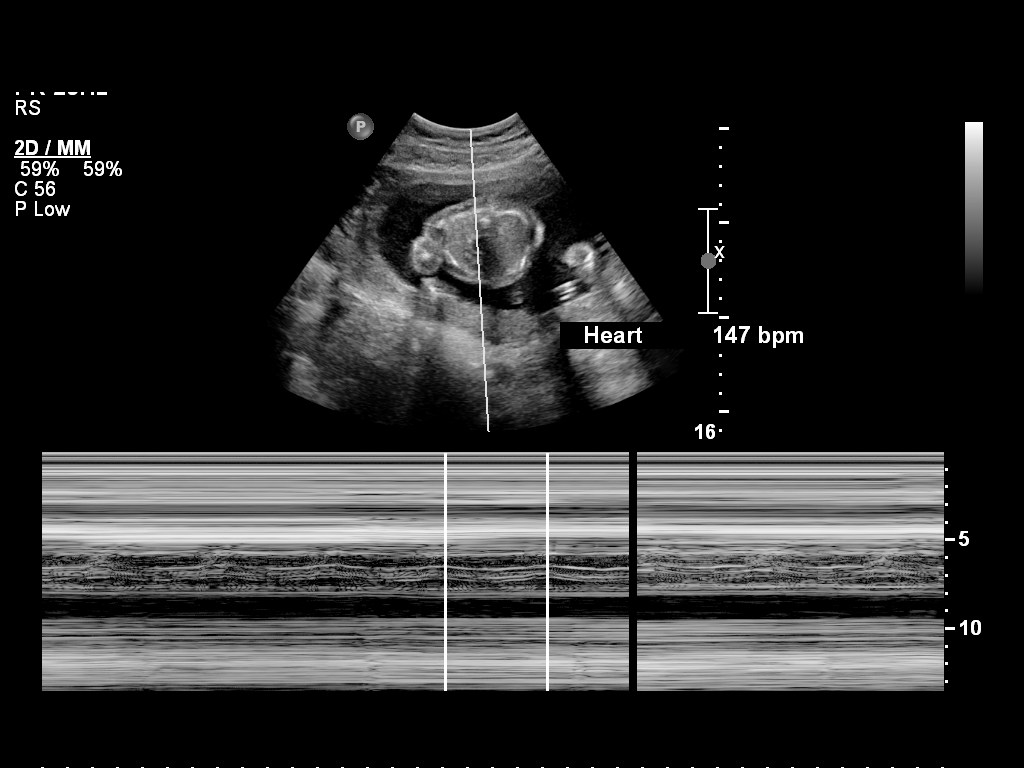
[im 3/7]
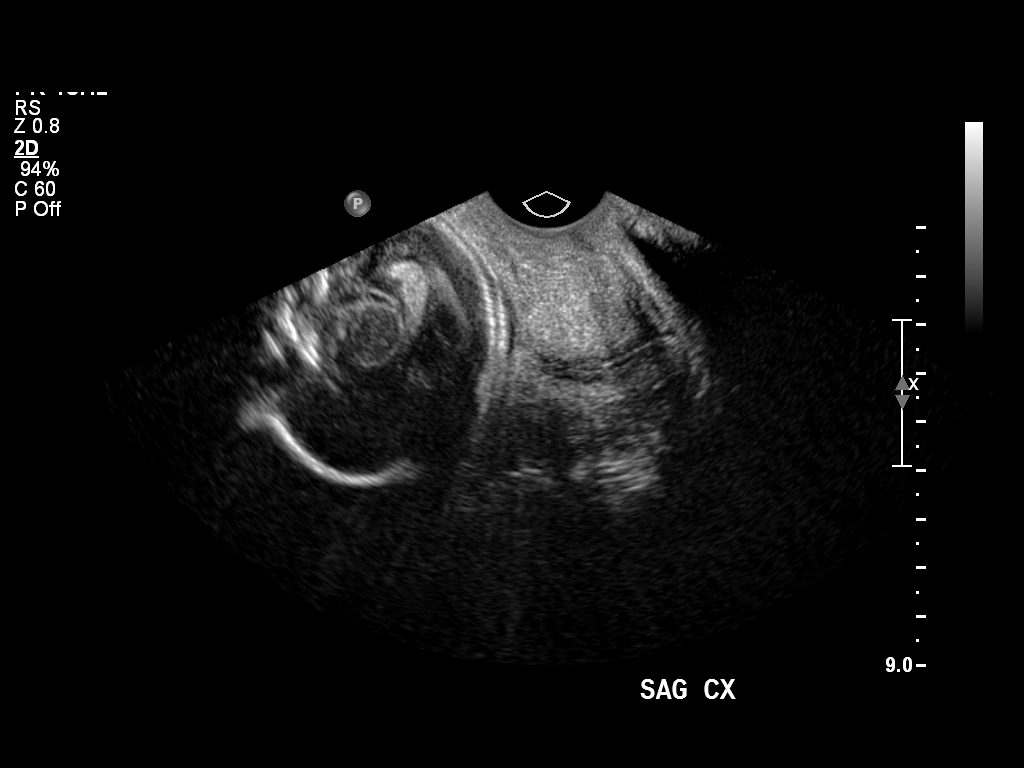
[im 4/7]
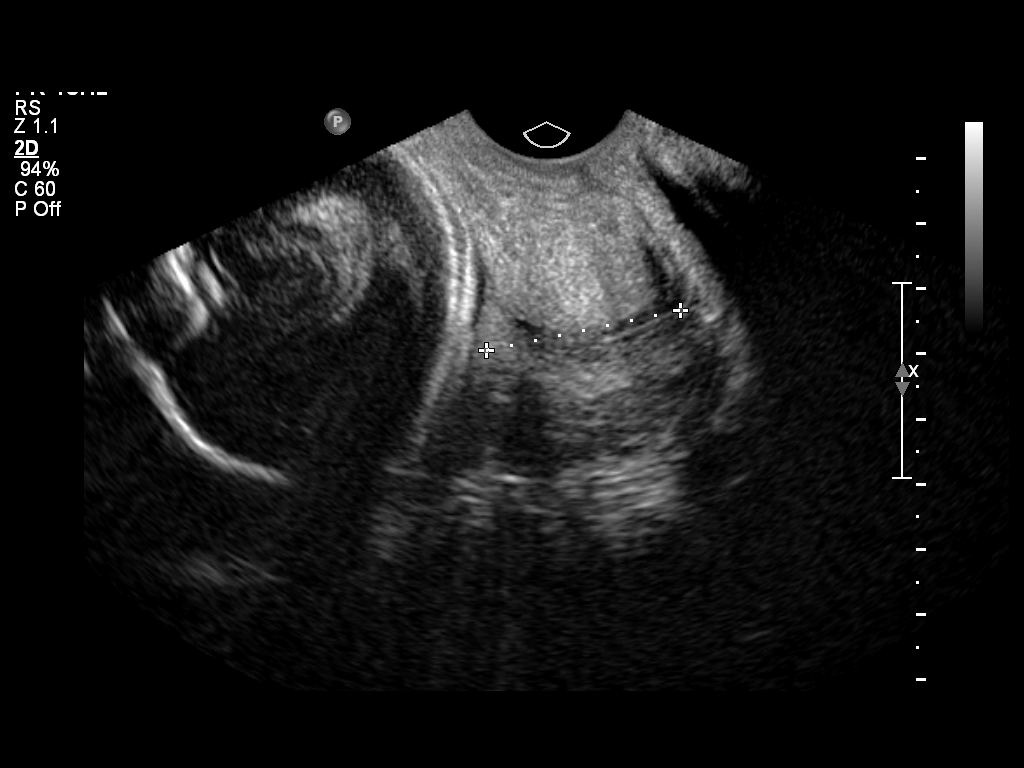
[im 5/7]
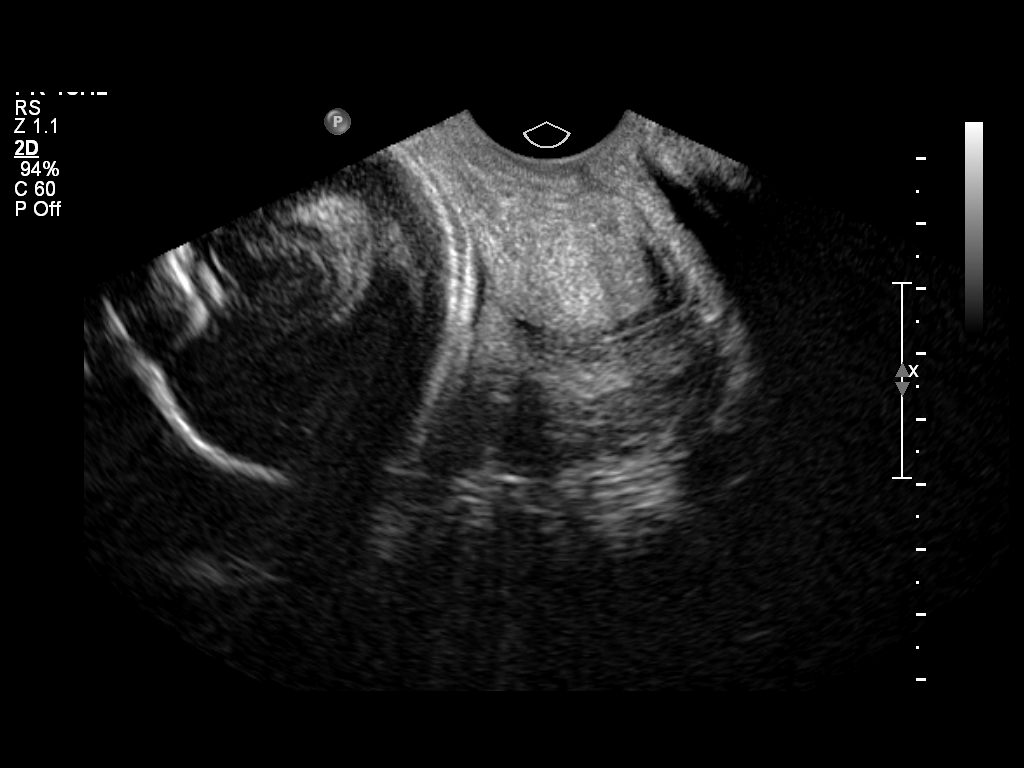
[im 6/7]
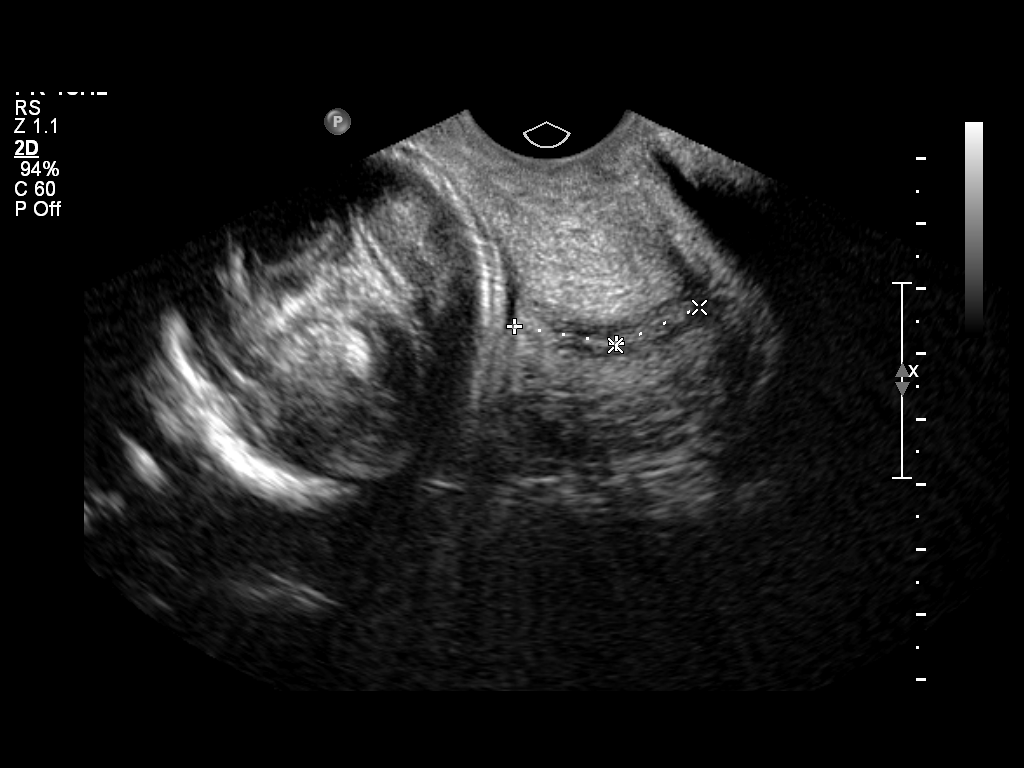
[im 7/7]
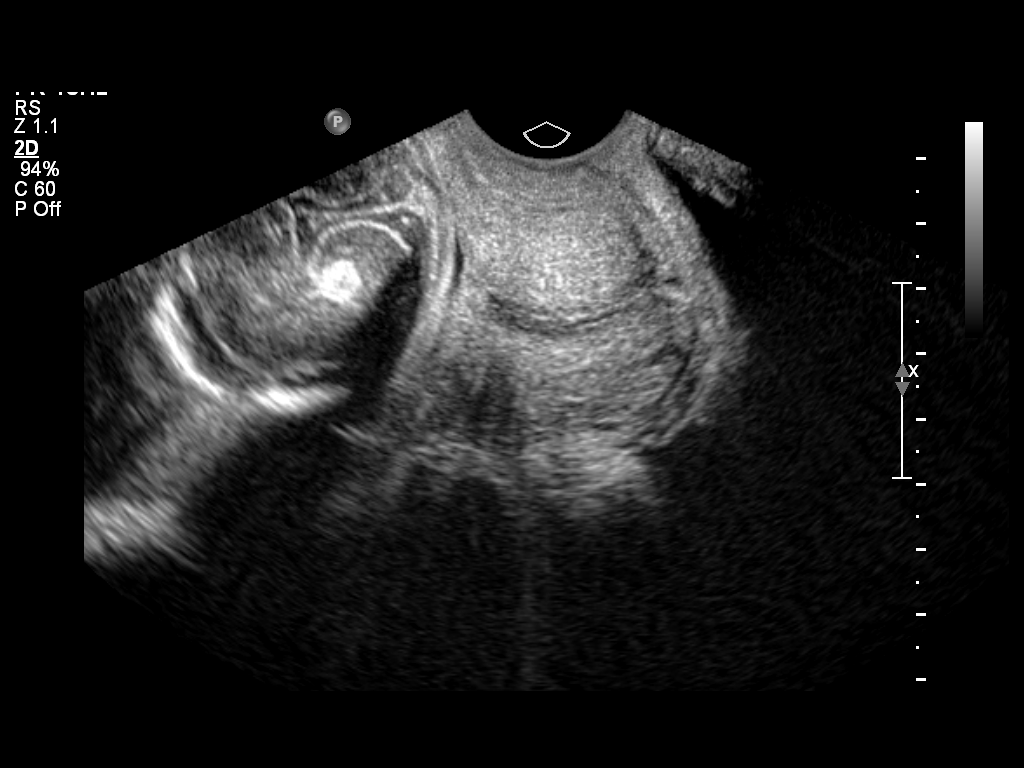

[7 of 7 positions shown; findings below may reference images not displayed]

IMPRESSION: See AS Obstetric US report.

## 2010-12-03 IMAGING — US US OB TRANSVAGINAL
1 series · 14 of 21 positions shown · non-contrast
Comparison: none

OBSTETRICAL ULTRASOUND:
 This ultrasound exam was performed in the [HOSPITAL] Ultrasound Department.  The OB US report was generated in the AS system, and faxed to the ordering physician.  This report is also available in [HOSPITAL]?s AccessANYware and in [REDACTED] PACS.

[Series 1: us ob transvaginal · 0.15mm/px · 14 of 21 slices shown]
[im 1/21]
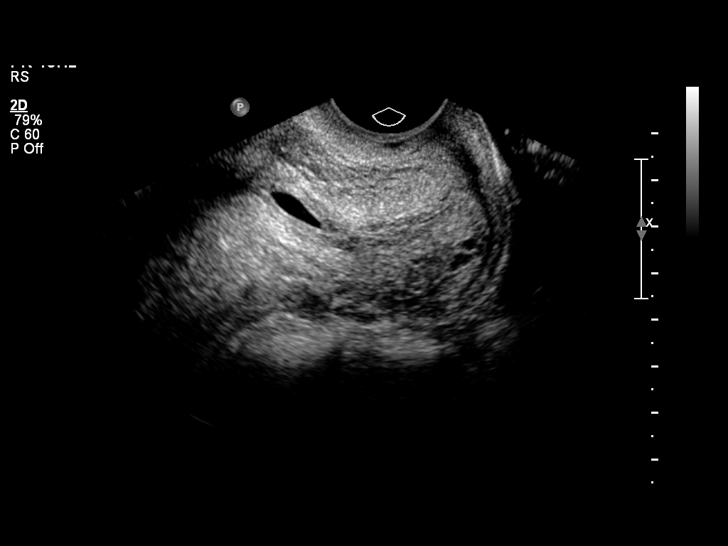
[im 3/21]
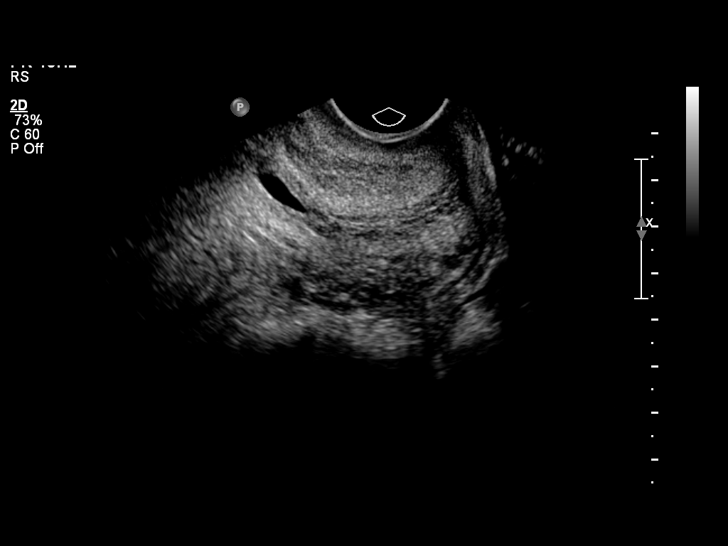
[im 4/21]
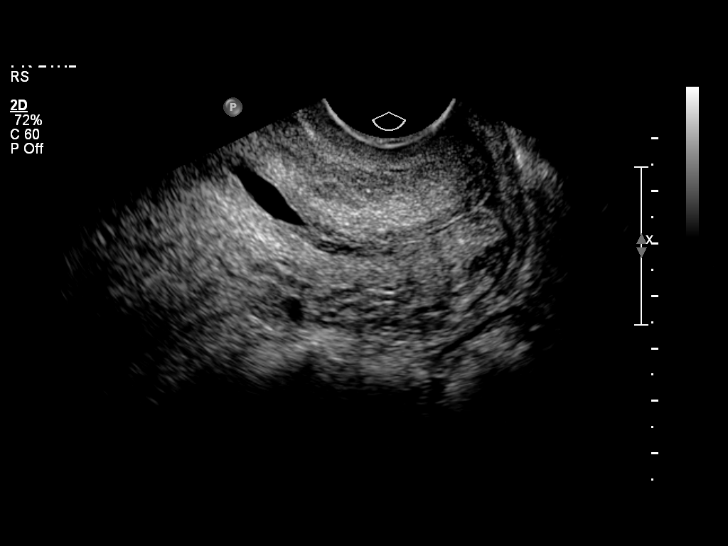
[im 6/21]
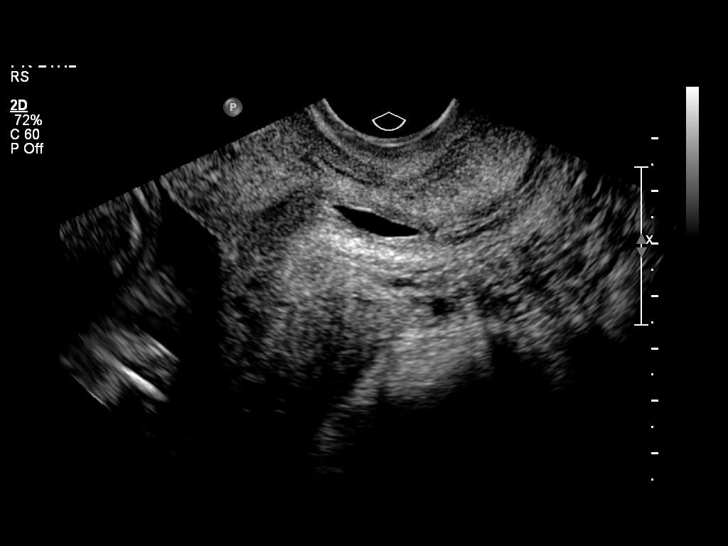
[im 7/21]
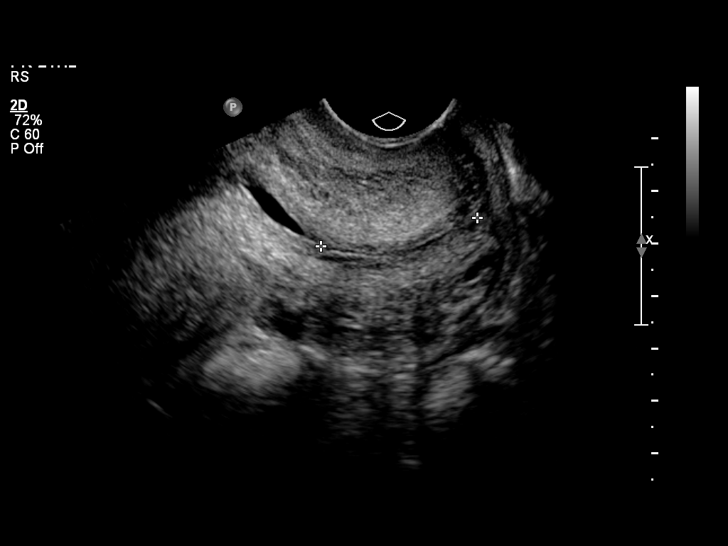
[im 9/21]
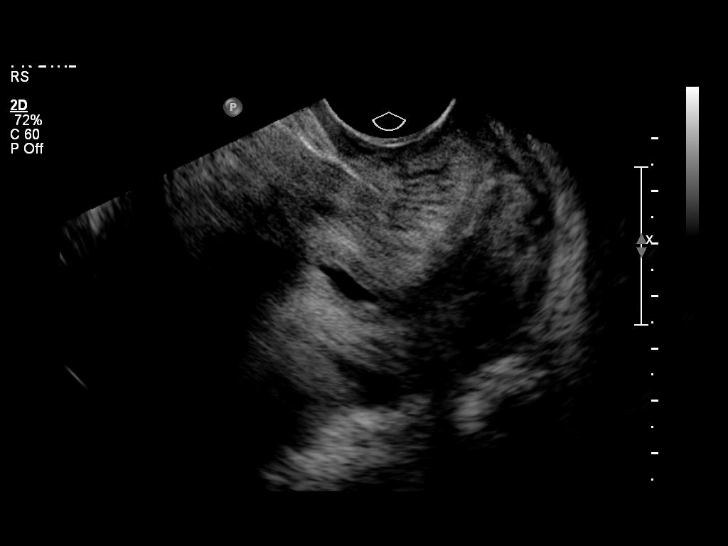
[im 10/21]
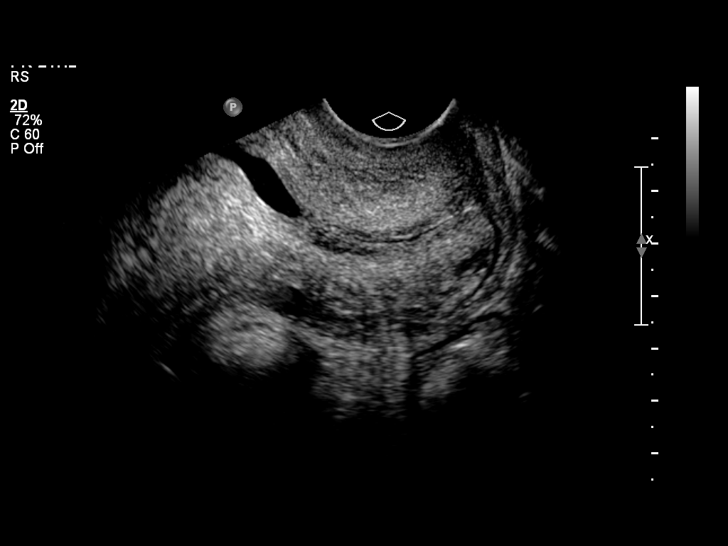
[im 12/21]
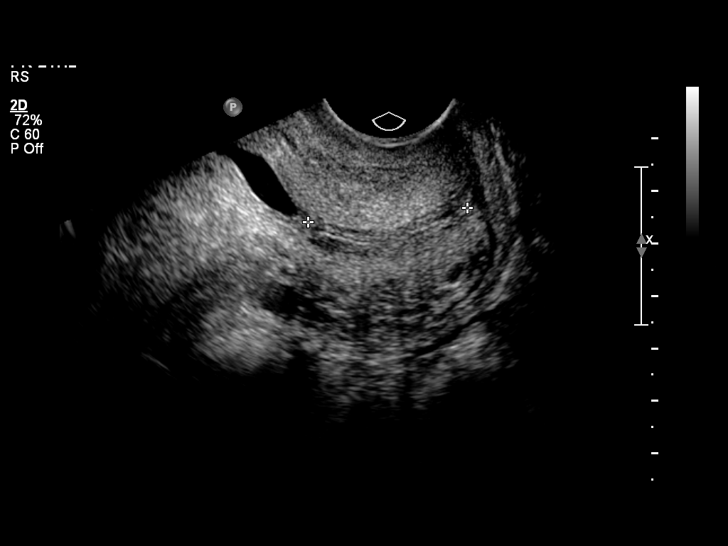
[im 13/21]
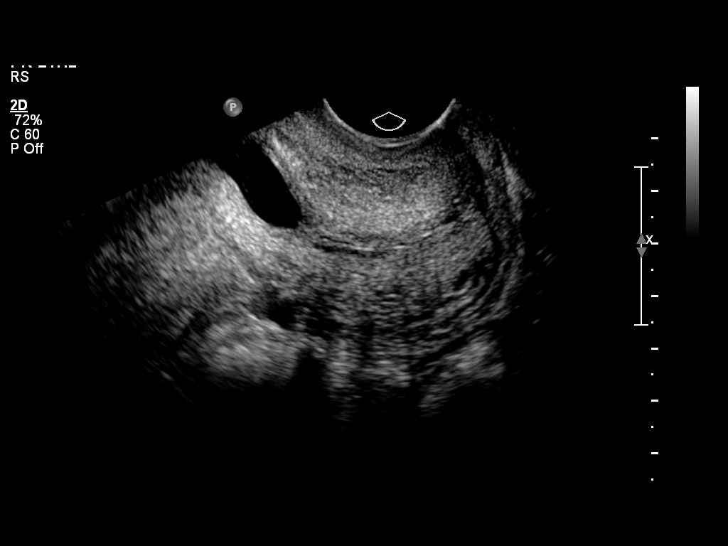
[im 15/21]
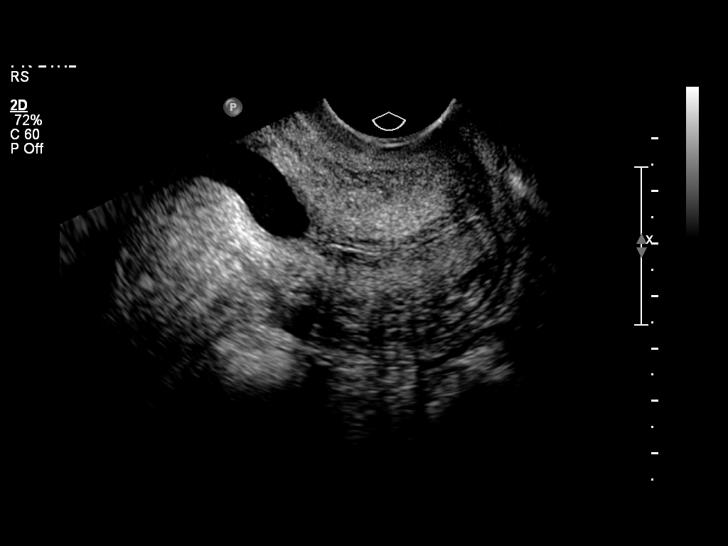
[im 16/21]
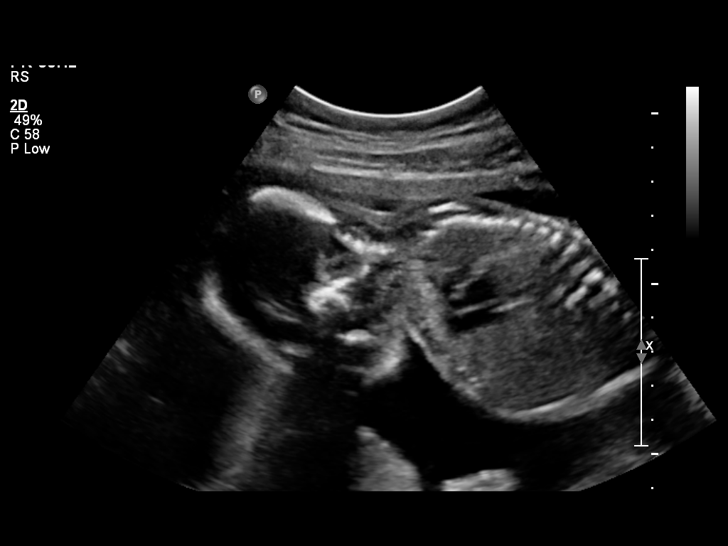
[im 18/21]
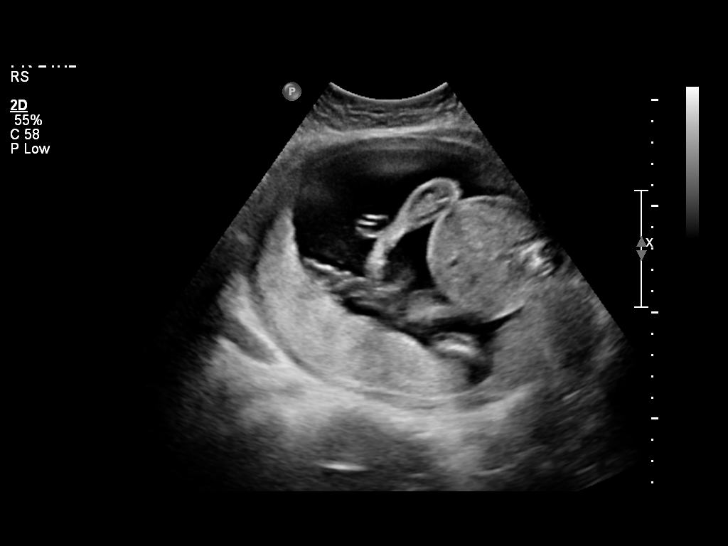
[im 19/21]
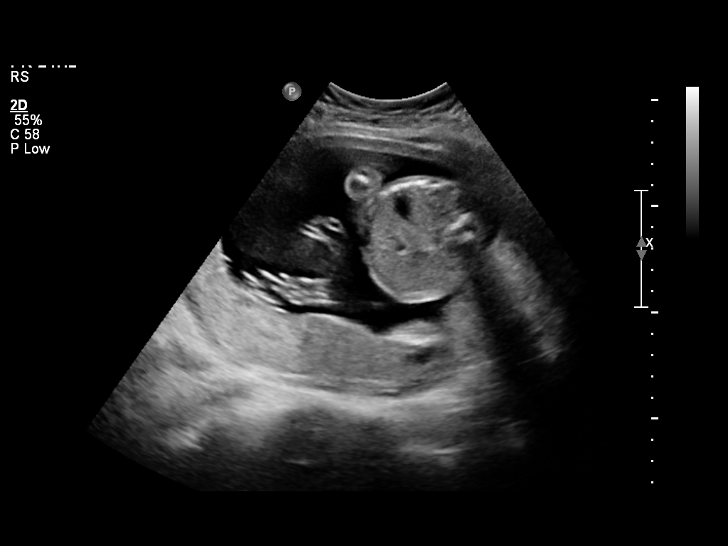
[im 21/21]
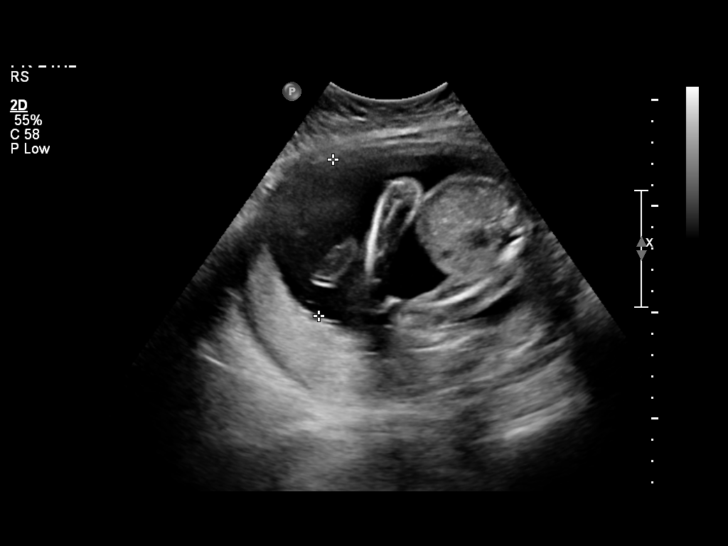

[14 of 21 positions shown; findings below may reference images not displayed]

IMPRESSION: See AS Obstetric US report.

## 2012-06-06 ENCOUNTER — Ambulatory Visit: Payer: Managed Care, Other (non HMO) | Admitting: *Deleted

## 2012-06-06 VITALS — BP 128/77 | HR 69 | Temp 99.2°F | Resp 16 | Ht 62.5 in | Wt 152.0 lb

## 2012-06-06 DIAGNOSIS — N912 Amenorrhea, unspecified: Secondary | ICD-10-CM

## 2012-06-06 DIAGNOSIS — R002 Palpitations: Secondary | ICD-10-CM

## 2012-06-06 DIAGNOSIS — F411 Generalized anxiety disorder: Secondary | ICD-10-CM

## 2012-06-06 LAB — POCT CBC
Granulocyte percent: 56.2 %G (ref 37–80)
Hemoglobin: 12.5 g/dL (ref 12.2–16.2)
Lymph, poc: 2.7 (ref 0.6–3.4)
MCHC: 31.2 g/dL — AB (ref 31.8–35.4)
MPV: 8.3 fL (ref 0–99.8)
POC Granulocyte: 4.2 (ref 2–6.9)
POC MID %: 7.5 %M (ref 0–12)

## 2012-06-06 MED ORDER — LORAZEPAM 0.5 MG PO TABS: 0.5000 mg | ORAL_TABLET | Freq: Every day | ORAL | Status: DC | PRN

## 2012-06-06 NOTE — Patient Instructions (Signed)
It was good to meet you tonight.  Take the Ativan for relief of anxiety if you need it.    Come back in 2 weeks or so, so that we know things are improving.  We will call you with the results of the tests.

## 2012-06-06 NOTE — Progress Notes (Signed)
Lauren Frost is a 37 y.o. female who presents to Urgent Care today with complaints of increasing anxiety:  1.  Anxiety:  For past 2 weeks, patient has been feeling more anxious than usual.  Several times lasts only a few minutes at a time, other times lasts for 20 -30 minutes.  Racing thoughts at night keep her from sleep.  Occasionally distracts her from work as well.  Has a history of panic attacks in the past, but has not had these for several years.  Has never sought treatment for these.  Currently, symptoms are racing heart that lasts for several minutes and then resolves.  No chest pain or shortness of breath.  Occurs while resting, at work, watching television.  Denies this ever being assocaited with exertion.  Has history of stress but states no increased stress at work/home at this time.  Denies any depressive symptoms.  No SI/HI.    She is several days late on her menses.  Usually first 1-2 days of the month.  She had BTL several years ago and has been having regular periods since then.  She would like to be checked for pregnancy.  Discussed that likely not positive at this time (too soon) but will check to be sure.    No family history or personal history of thyroid issues.  Not taking any medications except OTC multi-vitamin.   PMH reviewed.  Past Medical History  Diagnosis Date  . Allergy    Past Surgical History  Procedure Laterality Date  . Tubal ligation  June 09 2009  . Fracture surgery  1998    Left tib/fib open compound fracture    Medications reviewed. Current Outpatient Prescriptions  Medication Sig Dispense Refill  . Multiple Vitamin (MULTIVITAMIN) tablet Take 1 tablet by mouth daily.       No current facility-administered medications for this visit.    ROS as above otherwise neg.  No abdominal pain/N/V.    Physical Exam:  BP 128/77  Pulse 69  Temp(Src) 99.2 F (37.3 C)  Resp 16  Ht 5' 2.5" (1.588 m)  Wt 152 lb (68.947 kg)  BMI 27.34 kg/m2  LMP  05/04/2012 Gen:  Alert, cooperative patient who appears stated age in no acute distress.  Vital signs reviewed. HEENT: EOMI,  MMM Pulm:  Clear to auscultation bilaterally with good air movement.  No wheezes or rales noted.   Cardiac:  Regular rate and rhythm without murmur auscultated.  Good S1/S2. Abd:  Soft/nondistended/nontender.  Good bowel sounds throughout all four quadrants.  No masses noted.  Exts: Non edematous BL  LE, warm and well perfused.   Results for orders placed in visit on 06/06/12  POCT URINE PREGNANCY      Result Value Range   Preg Test, Ur Negative      Assessment and Plan:  1.  Anxiety: - Treat as anxiety exacerbation in female who has history for the same. - Ativan for relief.  To use this if anxiety detracts from sleep/work.   - FU in 2 weeks to assess for improvement.  If still having trouble, recommend SSRI at that time - TSH today, as well as CMET and CBC  - Does not have a PCP.  Does not have insurance.

## 2012-06-07 LAB — COMPREHENSIVE METABOLIC PANEL
ALT: 20 U/L (ref 0–35)
AST: 27 U/L (ref 0–37)
Calcium: 9 mg/dL (ref 8.4–10.5)
Chloride: 104 mEq/L (ref 96–112)
Creat: 0.95 mg/dL (ref 0.50–1.10)
Sodium: 137 mEq/L (ref 135–145)
Total Bilirubin: 0.4 mg/dL (ref 0.3–1.2)
Total Protein: 6.7 g/dL (ref 6.0–8.3)

## 2012-06-07 LAB — TSH: TSH: 2.907 u[IU]/mL (ref 0.350–4.500)

## 2012-06-10 ENCOUNTER — Telehealth: Payer: Self-pay

## 2012-06-10 NOTE — Telephone Encounter (Signed)
Pt requesting lab results  Best: [phone]  bf

## 2012-06-10 NOTE — Telephone Encounter (Signed)
Patient advised of normal labs.  

## 2012-06-10 NOTE — Telephone Encounter (Signed)
Dr Gwendolyn Grant patient, he is unfortunately not going to be here this evening, will you review labs?

## 2012-06-10 NOTE — Telephone Encounter (Signed)
Labs normal.

## 2012-06-11 ENCOUNTER — Telehealth: Payer: Self-pay | Admitting: Radiology

## 2012-06-11 NOTE — Telephone Encounter (Signed)
Incorrect order was put in, the CBC was done in house, see results below. There is not a way to order this at [facility], there was not a purple top sent.         Results   POCT CBC (Order [id number])       POCT CBC  Status: Final result     Visible to patient: This result is not viewable by the patient.     Next appt: None      Dx: Amenorrhea                   Range 5d ago (06/06/12) 57yr ago (06/09/09) 22yr ago (06/08/09) 84yr ago (04/28/09) 51yr ago (04/12/09) 56yr ago (11/18/08) 43yr ago (11/03/08)   WBC 4.6 - 10.2 K/uL 7.4    12.4 (H) R   11.5 (H) R   14.1 (H) R   9.7 R   7.0 R   9.7 R     Lymph, poc 0.6 - 3.4  2.7                  POC LYMPH PERCENT 10 - 50 %L 36.3                  MID (cbc) 0 - 0.9  0.6                  POC MID % 0 - 12 %M 7.5                  POC Granulocyte 2 - 6.9  4.2                  Granulocyte percent 37 - 80 %G 56.2                  RBC 4.04 - 5.48 M/uL 3.96 (A)    3.27 (L) R   3.53 (L) R   3.38 (L) R   3.44 (L) R   3.32 (L) R   2.57 (L) R     Hemoglobin 12.2 - 16.2 g/dL 54.0    98.1 (L) R   19.1 (L) R   11.3 (L) R   10.7 (L) R   10.8 (L) R   8.8 (L) R     HCT, POC 37.7 - 47.9 % 40.1    33.1 (L) R   35.3 (L) R   33.7 (L) R   32.9 (L) R   33.2 (L) R   25.6 (L) R     MCV 80 - 97 fL 101.2 (A)    101.4 (H) R   100.0 R   99.8 R   95.6 R   100.0 R   99.8 R     MCH, POC 27 - 31.2 pg 31.6 (A)                  MCHC 31.8 - 35.4 g/dL 47.8 (A)    29.5 R   62.1 R   33.4 R   32.5 R   32.5 R   34.3 R          POCT CBC (Order [id number])  Point of Care Testing  Order: [id number]   Date: 06/06/2012  Department: [facility]  Ordering/Authorizing: Tobey Grim, MD        Order Information    Order Date/Time Release Date/Time Start Date/Time End Date/Time    06/06/2012  9:28 PM None 06/06/2012 None  Order Details    Frequency Duration Priority Order Class    None None Routine Point Of Care         Associated Diagnoses    Amenorrhea   - Primary            Order History Outpatient    Date/Time Action Taken User Additional Information    06/06/12 2128 Sign [place], CMA Ordering Mode: Verbal with readback    06/06/12 2129 Result Ginger Colin Mulders, CMA Final    06/07/12 0919 Verbal Cosign Tobey Grim, MD        Order Requisition    POCT CBC (Order [id number]) on 06/06/12       Collection Information    Collection Date Collection Time Resulting Agency    06/06/2012  9:29 PM [facility]       Additional Information    Associated Reports    View Encounter    Priority and Order Details    Collection Information.                     Encounter    View Encounter      Result Information         Abnormal    Status: Final result (06/06/2012  9:29 PM)    Provider Status: Open        Lab Information    [facility]    [address]    [place], [place]               Order-Level Documents:    There are no order-level documents.         POCT CBC (Order [id number])  Point of Care Testing  Order: [id number]   Date: 06/06/2012  Department: [facility]  Ordering/Authorizing: Tobey Grim, MD        Order Information    Order Date/Time Release Date/Time Start Date/Time End Date/Time    06/06/2012  9:28 PM None 06/06/2012 None        Order Details    Frequency Duration Priority Order Class    None None Routine Point Of Care        Associated Diagnoses    Amenorrhea   - Primary            Order History Outpatient    Date/Time Action Taken User Additional Information    06/06/12 2128 Sign [place], [place] Ordering Mode: Verbal with readback    06/06/12 2129 Result Ginger Colin Mulders, CMA Final    06/07/12 0919 Verbal Cosign Tobey Grim, MD        Order Requisition    POCT CBC (Order [id number]) on 06/06/12       Collection Information    Collection Date Collection  Time Resulting Agency    06/06/2012  9:29 PM [facility]       Additional Information    Associated Reports    View Encounter    Priority and Order Details    Collection Information.

## 2012-06-11 NOTE — Telephone Encounter (Signed)
Message copied by Caffie Damme on Tue Jun 11, 2012  3:24 PM ------      Message from: Gwendolyn Grant, JEFFREY H      Created: Mon Jun 10, 2012  1:44 PM       Hey guys,            Is there a way to check POCT CBC after the patient has already left?  I had clinical staff order send-out CBC but instead it looks like POCT was done.  Any thoughts?              Thanks,            Trey Paula                              ----- Message -----         From: [facility]         Sent: 06/07/2012   2:02 PM           To: Tobey Grim, MD                   ------

## 2016-07-10 DIAGNOSIS — Z0000 Encounter for general adult medical examination without abnormal findings: Secondary | ICD-10-CM | POA: Diagnosis not present

## 2016-08-21 DIAGNOSIS — Z124 Encounter for screening for malignant neoplasm of cervix: Secondary | ICD-10-CM | POA: Diagnosis not present

## 2016-08-21 DIAGNOSIS — Z0000 Encounter for general adult medical examination without abnormal findings: Secondary | ICD-10-CM | POA: Diagnosis not present

## 2016-09-27 DIAGNOSIS — N898 Other specified noninflammatory disorders of vagina: Secondary | ICD-10-CM | POA: Diagnosis not present

## 2016-09-27 DIAGNOSIS — Z0000 Encounter for general adult medical examination without abnormal findings: Secondary | ICD-10-CM | POA: Diagnosis not present

## 2016-10-10 DIAGNOSIS — Z124 Encounter for screening for malignant neoplasm of cervix: Secondary | ICD-10-CM | POA: Diagnosis not present

## 2016-10-10 DIAGNOSIS — Z0000 Encounter for general adult medical examination without abnormal findings: Secondary | ICD-10-CM | POA: Diagnosis not present

## 2016-10-10 DIAGNOSIS — N898 Other specified noninflammatory disorders of vagina: Secondary | ICD-10-CM | POA: Diagnosis not present

## 2016-10-20 DIAGNOSIS — B009 Herpesviral infection, unspecified: Secondary | ICD-10-CM | POA: Diagnosis not present

## 2016-10-30 ENCOUNTER — Ambulatory Visit: Payer: Self-pay | Admitting: Obstetrics

## 2017-05-30 ENCOUNTER — Other Ambulatory Visit: Payer: Self-pay | Admitting: Internal Medicine

## 2017-05-30 DIAGNOSIS — Z1329 Encounter for screening for other suspected endocrine disorder: Secondary | ICD-10-CM

## 2017-05-30 DIAGNOSIS — Z1321 Encounter for screening for nutritional disorder: Secondary | ICD-10-CM

## 2017-05-30 DIAGNOSIS — Z0000 Encounter for general adult medical examination without abnormal findings: Secondary | ICD-10-CM

## 2017-05-30 DIAGNOSIS — Z13220 Encounter for screening for lipoid disorders: Secondary | ICD-10-CM

## 2017-06-11 ENCOUNTER — Other Ambulatory Visit: Payer: BLUE CROSS/BLUE SHIELD | Admitting: Internal Medicine

## 2017-06-11 DIAGNOSIS — Z1329 Encounter for screening for other suspected endocrine disorder: Secondary | ICD-10-CM | POA: Diagnosis not present

## 2017-06-11 DIAGNOSIS — Z1321 Encounter for screening for nutritional disorder: Secondary | ICD-10-CM | POA: Diagnosis not present

## 2017-06-11 DIAGNOSIS — Z0000 Encounter for general adult medical examination without abnormal findings: Secondary | ICD-10-CM | POA: Diagnosis not present

## 2017-06-11 DIAGNOSIS — Z13220 Encounter for screening for lipoid disorders: Secondary | ICD-10-CM

## 2017-06-11 NOTE — Addendum Note (Signed)
Addended by: Gregery NaVALENCIA, Oluwasemilore Bahl P on: 06/11/2017 10:09 AM   Modules accepted: Orders

## 2017-06-12 LAB — LIPID PANEL
Cholesterol: 194 mg/dL (ref <200)
HDL: 89 mg/dL (ref >50)
LDL CHOLESTEROL (CALC): 91 mg/dL
NON-HDL CHOLESTEROL (CALC): 105 mg/dL (ref <130)
TRIGLYCERIDES: 58 mg/dL (ref <150)
Total CHOL/HDL Ratio: 2.2 (calc) (ref <5.0)

## 2017-06-12 LAB — COMPLETE METABOLIC PANEL WITH GFR
AG Ratio: 1.8 (calc) (ref 1.0–2.5)
ALKALINE PHOSPHATASE (APISO): 60 U/L (ref 33–115)
ALT: 20 U/L (ref 6–29)
AST: 37 U/L — ABNORMAL HIGH (ref 10–30)
Albumin: 4.4 g/dL (ref 3.6–5.1)
BILIRUBIN TOTAL: 0.6 mg/dL (ref 0.2–1.2)
BUN: 10 mg/dL (ref 7–25)
CHLORIDE: 102 mmol/L (ref 98–110)
CO2: 28 mmol/L (ref 20–32)
Calcium: 9.4 mg/dL (ref 8.6–10.2)
Creat: 0.81 mg/dL (ref 0.50–1.10)
GFR, Est African American: 104 mL/min/{1.73_m2} (ref >=60)
GFR, Est Non African American: 90 mL/min/{1.73_m2} (ref >=60)
GLUCOSE: 92 mg/dL (ref 65–99)
Globulin: 2.5 g/dL (calc) (ref 1.9–3.7)
POTASSIUM: 4.1 mmol/L (ref 3.5–5.3)
Sodium: 138 mmol/L (ref 135–146)
Total Protein: 6.9 g/dL (ref 6.1–8.1)

## 2017-06-12 LAB — CBC WITH DIFFERENTIAL/PLATELET
Basophils Absolute: 52 cells/uL (ref 0–200)
Basophils Relative: 1.1 %
EOS PCT: 3 %
Eosinophils Absolute: 141 cells/uL (ref 15–500)
HEMATOCRIT: 40.7 % (ref 35.0–45.0)
HEMOGLOBIN: 13.8 g/dL (ref 11.7–15.5)
LYMPHS ABS: 1589 {cells}/uL (ref 850–3900)
MCH: 33.2 pg — ABNORMAL HIGH (ref 27.0–33.0)
MCHC: 33.9 g/dL (ref 32.0–36.0)
MCV: 97.8 fL (ref 80.0–100.0)
MONOS PCT: 7.8 %
MPV: 9.7 fL (ref 7.5–12.5)
NEUTROS ABS: 2552 {cells}/uL (ref 1500–7800)
NEUTROS PCT: 54.3 %
Platelets: 218 10*3/uL (ref 140–400)
RBC: 4.16 10*6/uL (ref 3.80–5.10)
RDW: 12.3 % (ref 11.0–15.0)
Total Lymphocyte: 33.8 %
WBC mixed population: 367 cells/uL (ref 200–950)
WBC: 4.7 10*3/uL (ref 3.8–10.8)

## 2017-06-12 LAB — VITAMIN D 25 HYDROXY (VIT D DEFICIENCY, FRACTURES): VIT D 25 HYDROXY: 31 ng/mL (ref 30–100)

## 2017-06-12 LAB — TSH: TSH: 1.23 mIU/L

## 2017-06-14 ENCOUNTER — Encounter: Payer: Self-pay | Admitting: Internal Medicine

## 2017-06-14 ENCOUNTER — Ambulatory Visit (INDEPENDENT_AMBULATORY_CARE_PROVIDER_SITE_OTHER): Payer: BLUE CROSS/BLUE SHIELD | Admitting: Internal Medicine

## 2017-06-14 VITALS — BP 112/80 | HR 72 | Ht 62.0 in | Wt 149.0 lb

## 2017-06-14 DIAGNOSIS — Z91018 Allergy to other foods: Secondary | ICD-10-CM

## 2017-06-14 DIAGNOSIS — Z0000 Encounter for general adult medical examination without abnormal findings: Secondary | ICD-10-CM | POA: Diagnosis not present

## 2017-06-14 LAB — POCT URINALYSIS DIPSTICK
APPEARANCE: NORMAL
Bilirubin, UA: NEGATIVE
GLUCOSE UA: NEGATIVE
Ketones, UA: NEGATIVE
Leukocytes, UA: NEGATIVE
NITRITE UA: NEGATIVE
Odor: NORMAL
Protein, UA: NEGATIVE
RBC UA: NEGATIVE
SPEC GRAV UA: 1.015 (ref 1.010–1.025)
Urobilinogen, UA: 0.2 E.U./dL
pH, UA: 7.5 (ref 5.0–8.0)
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
## 2017-06-28 ENCOUNTER — Encounter: Payer: Self-pay | Admitting: Internal Medicine

## 2017-06-28 NOTE — Patient Instructions (Addendum)
It was a pleasure to see you today.  Return in 1 year or as needed.  Take 2000 units vitamin D3 daily  

## 2017-07-10 ENCOUNTER — Ambulatory Visit
Admission: RE | Admit: 2017-07-10 | Discharge: 2017-07-10 | Disposition: A | Discharge status: Home / Self Care | Payer: BLUE CROSS/BLUE SHIELD | Source: Ambulatory Visit | Attending: Internal Medicine | Admitting: Internal Medicine

## 2017-07-10 DIAGNOSIS — Z0000 Encounter for general adult medical examination without abnormal findings: Secondary | ICD-10-CM

## 2017-07-10 DIAGNOSIS — Z1231 Encounter for screening mammogram for malignant neoplasm of breast: Secondary | ICD-10-CM | POA: Diagnosis not present

## 2018-11-29 DIAGNOSIS — Z20828 Contact with and (suspected) exposure to other viral communicable diseases: Secondary | ICD-10-CM | POA: Diagnosis not present

## 2019-01-06 ENCOUNTER — Other Ambulatory Visit: Payer: Self-pay

## 2019-01-06 DIAGNOSIS — Z20822 Contact with and (suspected) exposure to covid-19: Secondary | ICD-10-CM

## 2019-01-07 ENCOUNTER — Telehealth: Payer: Self-pay | Admitting: Internal Medicine

## 2019-01-07 LAB — NOVEL CORONAVIRUS, NAA: SARS-CoV-2, NAA: NOT DETECTED

## 2019-01-07 NOTE — Telephone Encounter (Signed)
Scheduled appointment

## 2019-01-07 NOTE — Telephone Encounter (Signed)
Lauren Frost [phone]  [name] called to say that she has a rash on her hands and feet that she can feel but not see, then she has on back of hands fore arms and inner thighs that she can see. She did go to [facility] yesterday to be tested and it was negatitive.

## 2019-01-07 NOTE — Telephone Encounter (Signed)
She can come tomorrow morning

## 2019-01-08 ENCOUNTER — Encounter: Payer: Self-pay | Admitting: Internal Medicine

## 2019-01-08 ENCOUNTER — Other Ambulatory Visit: Payer: Self-pay

## 2019-01-08 ENCOUNTER — Ambulatory Visit (INDEPENDENT_AMBULATORY_CARE_PROVIDER_SITE_OTHER): Payer: BC Managed Care – PPO | Admitting: Internal Medicine

## 2019-01-08 VITALS — BP 120/80 | HR 82 | Ht 62.0 in | Wt 180.0 lb

## 2019-01-08 DIAGNOSIS — R21 Rash and other nonspecific skin eruption: Secondary | ICD-10-CM

## 2019-01-08 LAB — CBC WITH DIFFERENTIAL/PLATELET
Absolute Monocytes: 561 cells/uL (ref 200–950)
Basophils Absolute: 59 cells/uL (ref 0–200)
Basophils Relative: 0.9 %
Eosinophils Absolute: 363 cells/uL (ref 15–500)
Eosinophils Relative: 5.5 %
HCT: 42.5 % (ref 35.0–45.0)
Hemoglobin: 14.2 g/dL (ref 11.7–15.5)
Lymphs Abs: 1921 cells/uL (ref 850–3900)
MCH: 32.9 pg (ref 27.0–33.0)
MCHC: 33.4 g/dL (ref 32.0–36.0)
MCV: 98.6 fL (ref 80.0–100.0)
MPV: 10 fL (ref 7.5–12.5)
Monocytes Relative: 8.5 %
Neutro Abs: 3696 cells/uL (ref 1500–7800)
Neutrophils Relative %: 56 %
Platelets: 262 10*3/uL (ref 140–400)
RBC: 4.31 10*6/uL (ref 3.80–5.10)
RDW: 12.3 % (ref 11.0–15.0)
Total Lymphocyte: 29.1 %
WBC: 6.6 10*3/uL (ref 3.8–10.8)

## 2019-01-08 LAB — SEDIMENTATION RATE: Sed Rate: 9 mm/h (ref 0–20)

## 2019-01-08 MED ORDER — CLONAZEPAM 0.5 MG PO TABS: 0.5000 mg | ORAL_TABLET | Freq: Two times a day (BID) | ORAL | 1 refills | Status: DC | PRN

## 2019-01-08 MED ORDER — PREDNISONE 10 MG PO TABS: ORAL_TABLET | ORAL | 0 refills | Status: DC

## 2019-01-08 NOTE — Progress Notes (Signed)
   Subjective:    Patient ID: Lauren Frost, female    DOB: 1975-11-01, 43 y.o.   MRN: [id number]  HPI 43 year old Female with history of allergy to tree nuts and latex as well as aspirin in with itching arms hands,legs and feet. Tried Benadryl and changing skin care products without relief. Uses Dove soap. Has not changed detergents.  Seen here 2014 for anxiety treated with Ativan.Seen here for CPE April 2019.  Significant other has had stroke on Friday and is hospitalized but this itching was going on well before that. Denies stress with job. Has 3 children.  Describes herself as a Educational psychologist. Watches diet and exercises.  Hx of panic disorder in remote past treated with Ativan 2014 at urgent care on [place].  Had Covid 19 test November 2nd which was negative.    Review of Systems see above- burning sensation of hands and feet. Also, itching hands, feet, arms and legs. No hx DM     Objective:   Physical Exam BP 120/80 afebrile Weight 180 pounds. Skin warm. Arms and legs feel dry. There is a fine papular rash on arms. Similar rash on legs. Erythema of palms and soles.  No urticarial lesions noted.       Assessment & Plan:  Itching and fine rash may be exacerbated by anxiety  Plan: CBC with differential and sed rate.  Treat with prednisone 10 mg (# 21 tablets) going from 60 mg to 0 mg over 7 days in tapering course.  Eucerin cream 4 ounces with triamcinolone 0.1% cream 30 g to use on scan 2-3 times daily for itching.  Klonopin 0.5 mg once or twice daily as needed for anxiety.  Call if not getting better in 7 to 10 days or sooner if worse.  Have not given her Atarax for itching as it may cause drowsiness.  See if symptoms will resolve with this treatment.  Otherwise might need allergy consultation and/or dermatology consultation.

## 2019-01-08 NOTE — Patient Instructions (Signed)
CBC and sed rate pending.  Treat with tapering course of prednisone over 7 days.  May take Klonopin 0.5 mg up to twice daily for anxiety and insomnia.  Apply triamcinolone cream with Eucerin cream2- 3 times daily for itching.

## 2019-01-22 ENCOUNTER — Telehealth: Payer: Self-pay | Admitting: Internal Medicine

## 2019-01-22 NOTE — Telephone Encounter (Signed)
Lauren Frost [phone]  Lauren Frost called to say that on the last day of her taking the prednisone that her hands started turning darker and peeling, and now her feet are doing the same thing, even the backs of her hands are turning a couple of shades darker and then she can peel layers of skin off. Palms of her hands are still itching. Is this a reaction from the prednisone?

## 2019-01-22 NOTE — Telephone Encounter (Signed)
It is the inflammatory state she had. It should resolve. Keep skin well moisturized.

## 2019-01-22 NOTE — Telephone Encounter (Signed)
Called and let patient know what Dr Baxley said and she verbalized understanding. 

## 2019-04-08 ENCOUNTER — Other Ambulatory Visit: Payer: BC Managed Care – PPO | Admitting: Internal Medicine

## 2019-04-08 ENCOUNTER — Other Ambulatory Visit: Payer: Self-pay

## 2019-04-08 DIAGNOSIS — Z13220 Encounter for screening for lipoid disorders: Secondary | ICD-10-CM

## 2019-04-08 DIAGNOSIS — Z0000 Encounter for general adult medical examination without abnormal findings: Secondary | ICD-10-CM

## 2019-04-08 DIAGNOSIS — E785 Hyperlipidemia, unspecified: Secondary | ICD-10-CM | POA: Diagnosis not present

## 2019-04-08 DIAGNOSIS — E7800 Pure hypercholesterolemia, unspecified: Secondary | ICD-10-CM | POA: Diagnosis not present

## 2019-04-08 DIAGNOSIS — Z1329 Encounter for screening for other suspected endocrine disorder: Secondary | ICD-10-CM

## 2019-04-11 ENCOUNTER — Other Ambulatory Visit: Payer: Self-pay

## 2019-04-11 ENCOUNTER — Ambulatory Visit (INDEPENDENT_AMBULATORY_CARE_PROVIDER_SITE_OTHER): Payer: BC Managed Care – PPO | Admitting: Internal Medicine

## 2019-04-11 ENCOUNTER — Encounter: Payer: Self-pay | Admitting: Internal Medicine

## 2019-04-11 VITALS — BP 150/80 | HR 81 | Temp 98.0°F | Ht 62.0 in | Wt 180.0 lb

## 2019-04-11 DIAGNOSIS — Z872 Personal history of diseases of the skin and subcutaneous tissue: Secondary | ICD-10-CM

## 2019-04-11 DIAGNOSIS — Z91018 Allergy to other foods: Secondary | ICD-10-CM

## 2019-04-11 DIAGNOSIS — Z0000 Encounter for general adult medical examination without abnormal findings: Secondary | ICD-10-CM

## 2019-04-11 DIAGNOSIS — E7800 Pure hypercholesterolemia, unspecified: Secondary | ICD-10-CM

## 2019-04-11 LAB — POCT URINALYSIS DIPSTICK
Appearance: NEGATIVE
Bilirubin, UA: NEGATIVE
Blood, UA: NEGATIVE
Glucose, UA: NEGATIVE
Ketones, UA: NEGATIVE
Leukocytes, UA: NEGATIVE
Nitrite, UA: NEGATIVE
Odor: NEGATIVE
Protein, UA: NEGATIVE
Spec Grav, UA: 1.015 (ref 1.010–1.025)
Urobilinogen, UA: 0.2 E.U./dL
pH, UA: 6.5 (ref 5.0–8.0)

## 2019-04-11 MED ORDER — CLONAZEPAM 0.5 MG PO TABS: 0.5000 mg | ORAL_TABLET | Freq: Two times a day (BID) | ORAL | 1 refills | Status: AC | PRN
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
## 2019-04-12 LAB — COMPLETE METABOLIC PANEL WITH GFR
AG Ratio: 1.7 (calc) (ref 1.0–2.5)
ALT: 16 U/L (ref 6–29)
AST: 22 U/L (ref 10–30)
Albumin: 4.5 g/dL (ref 3.6–5.1)
Alkaline phosphatase (APISO): 68 U/L (ref 31–125)
BUN: 11 mg/dL (ref 7–25)
CO2: 25 mmol/L (ref 20–32)
Calcium: 9.5 mg/dL (ref 8.6–10.2)
Chloride: 101 mmol/L (ref 98–110)
Creat: 0.86 mg/dL (ref 0.50–1.10)
GFR, Est African American: 95 mL/min/{1.73_m2} (ref >=60)
GFR, Est Non African American: 82 mL/min/{1.73_m2} (ref >=60)
Globulin: 2.7 g/dL (calc) (ref 1.9–3.7)
Glucose, Bld: 91 mg/dL (ref 65–99)
Potassium: 4.4 mmol/L (ref 3.5–5.3)
Sodium: 137 mmol/L (ref 135–146)
Total Bilirubin: 0.4 mg/dL (ref 0.2–1.2)
Total Protein: 7.2 g/dL (ref 6.1–8.1)

## 2019-04-12 LAB — CBC WITH DIFFERENTIAL/PLATELET
Absolute Monocytes: 501 cells/uL (ref 200–950)
Basophils Absolute: 39 cells/uL (ref 0–200)
Basophils Relative: 0.6 %
Eosinophils Absolute: 273 cells/uL (ref 15–500)
Eosinophils Relative: 4.2 %
HCT: 41 % (ref 35.0–45.0)
Hemoglobin: 13.8 g/dL (ref 11.7–15.5)
Lymphs Abs: 1853 cells/uL (ref 850–3900)
MCH: 32.3 pg (ref 27.0–33.0)
MCHC: 33.7 g/dL (ref 32.0–36.0)
MCV: 96 fL (ref 80.0–100.0)
MPV: 9.8 fL (ref 7.5–12.5)
Monocytes Relative: 7.7 %
Neutro Abs: 3835 cells/uL (ref 1500–7800)
Neutrophils Relative %: 59 %
Platelets: 259 10*3/uL (ref 140–400)
RBC: 4.27 10*6/uL (ref 3.80–5.10)
RDW: 12.3 % (ref 11.0–15.0)
Total Lymphocyte: 28.5 %
WBC: 6.5 10*3/uL (ref 3.8–10.8)

## 2019-04-12 LAB — LIPID PANEL
Cholesterol: 221 mg/dL — ABNORMAL HIGH (ref <200)
HDL: 73 mg/dL (ref >=50)
LDL Cholesterol (Calc): 131 mg/dL (calc) — ABNORMAL HIGH
Non-HDL Cholesterol (Calc): 148 mg/dL (calc) — ABNORMAL HIGH (ref <130)
Total CHOL/HDL Ratio: 3 (calc) (ref <5.0)
Triglycerides: 74 mg/dL (ref <150)

## 2019-04-12 LAB — TEST AUTHORIZATION

## 2019-04-12 LAB — VITAMIN D 25 HYDROXY (VIT D DEFICIENCY, FRACTURES): Vit D, 25-Hydroxy: 46 ng/mL (ref 30–100)

## 2019-04-12 LAB — TSH: TSH: 2.19 mIU/L

## 2019-04-26 NOTE — Patient Instructions (Signed)
It has been a pleasure to take care of you.  Good luck with your move to [place].  We will be happy to transfer your records to physician of your choice with written notification.

## 2020-01-16 ENCOUNTER — Telehealth: Payer: Self-pay | Admitting: Internal Medicine

## 2020-01-16 DIAGNOSIS — Z029 Encounter for administrative examinations, unspecified: Secondary | ICD-10-CM

## 2020-01-16 NOTE — Telephone Encounter (Signed)
Faxed Medical Records to [facility], fax [phone], phone number [phone].
